# Patient Record
Sex: Male | Born: 1990 | Race: White | Hispanic: No | Marital: Single | Smoking: Never smoker
Health system: Southern US, Community
[De-identification: ages and names within clinical notes are randomized; demographics above are authoritative.]

## PROBLEM LIST (undated history)

## (undated) DIAGNOSIS — R011 Cardiac murmur, unspecified: Secondary | ICD-10-CM

## (undated) DIAGNOSIS — Z98811 Dental restoration status: Secondary | ICD-10-CM

## (undated) DIAGNOSIS — S02609A Fracture of mandible, unspecified, initial encounter for closed fracture: Secondary | ICD-10-CM

## (undated) HISTORY — PX: WISDOM TOOTH EXTRACTION: SHX21

---

## 2017-06-06 ENCOUNTER — Emergency Department (HOSPITAL_COMMUNITY): Payer: BLUE CROSS/BLUE SHIELD

## 2017-06-06 ENCOUNTER — Emergency Department (HOSPITAL_COMMUNITY): Payer: BLUE CROSS/BLUE SHIELD | Admitting: Certified Registered"

## 2017-06-06 ENCOUNTER — Observation Stay (HOSPITAL_COMMUNITY)
Admission: EM | Admit: 2017-06-06 | Discharge: 2017-06-07 | Disposition: A | Discharge status: Home / Self Care | Payer: BLUE CROSS/BLUE SHIELD | Attending: Otolaryngology | Admitting: Otolaryngology

## 2017-06-06 ENCOUNTER — Encounter (HOSPITAL_COMMUNITY): Payer: Self-pay | Admitting: Emergency Medicine

## 2017-06-06 ENCOUNTER — Encounter (HOSPITAL_COMMUNITY)
Admission: EM | Disposition: A | Discharge status: Home / Self Care | Payer: Self-pay | Source: Home / Self Care | Attending: Emergency Medicine

## 2017-06-06 DIAGNOSIS — S0181XA Laceration without foreign body of other part of head, initial encounter: Secondary | ICD-10-CM | POA: Diagnosis not present

## 2017-06-06 DIAGNOSIS — S0240DA Maxillary fracture, left side, initial encounter for closed fracture: Secondary | ICD-10-CM | POA: Diagnosis not present

## 2017-06-06 DIAGNOSIS — S0269XA Fracture of mandible of other specified site, initial encounter for closed fracture: Secondary | ICD-10-CM | POA: Diagnosis not present

## 2017-06-06 DIAGNOSIS — S0266XA Fracture of symphysis of mandible, initial encounter for closed fracture: Secondary | ICD-10-CM | POA: Diagnosis not present

## 2017-06-06 DIAGNOSIS — S0240CA Maxillary fracture, right side, initial encounter for closed fracture: Secondary | ICD-10-CM | POA: Insufficient documentation

## 2017-06-06 DIAGNOSIS — S022XXA Fracture of nasal bones, initial encounter for closed fracture: Secondary | ICD-10-CM | POA: Diagnosis not present

## 2017-06-06 DIAGNOSIS — D72829 Elevated white blood cell count, unspecified: Secondary | ICD-10-CM

## 2017-06-06 DIAGNOSIS — S02609A Fracture of mandible, unspecified, initial encounter for closed fracture: Secondary | ICD-10-CM

## 2017-06-06 DIAGNOSIS — R55 Syncope and collapse: Secondary | ICD-10-CM | POA: Diagnosis present

## 2017-06-06 DIAGNOSIS — W19XXXA Unspecified fall, initial encounter: Secondary | ICD-10-CM | POA: Diagnosis not present

## 2017-06-06 DIAGNOSIS — S02411B LeFort I fracture, initial encounter for open fracture: Secondary | ICD-10-CM

## 2017-06-06 HISTORY — DX: Fracture of mandible, unspecified, initial encounter for closed fracture: S02.609A

## 2017-06-06 HISTORY — PX: ORIF MANDIBULAR FRACTURE: SHX2127

## 2017-06-06 LAB — CBC WITH DIFFERENTIAL/PLATELET
BASOS PCT: 0 %
Basophils Absolute: 0 10*3/uL (ref 0.0–0.1)
EOS PCT: 0 %
Eosinophils Absolute: 0 10*3/uL (ref 0.0–0.7)
HCT: 46.9 % (ref 39.0–52.0)
Hemoglobin: 15.9 g/dL (ref 13.0–17.0)
Lymphocytes Relative: 3 %
Lymphs Abs: 0.5 10*3/uL — ABNORMAL LOW (ref 0.7–4.0)
MCH: 31.9 pg (ref 26.0–34.0)
MCHC: 33.9 g/dL (ref 30.0–36.0)
MCV: 94 fL (ref 78.0–100.0)
MONO ABS: 0.7 10*3/uL (ref 0.1–1.0)
Monocytes Relative: 4 %
NEUTROS ABS: 15.9 10*3/uL — AB (ref 1.7–7.7)
Neutrophils Relative %: 93 %
PLATELETS: 305 10*3/uL (ref 150–400)
RBC: 4.99 MIL/uL (ref 4.22–5.81)
RDW: 13.2 % (ref 11.5–15.5)
WBC: 17.2 10*3/uL — ABNORMAL HIGH (ref 4.0–10.5)

## 2017-06-06 LAB — COMPREHENSIVE METABOLIC PANEL
ALBUMIN: 5.4 g/dL — AB (ref 3.5–5.0)
ALK PHOS: 44 U/L (ref 38–126)
ALT: 21 U/L (ref 17–63)
ANION GAP: 10 (ref 5–15)
AST: 28 U/L (ref 15–41)
BILIRUBIN TOTAL: 2.4 mg/dL — AB (ref 0.3–1.2)
BUN: 17 mg/dL (ref 6–20)
CALCIUM: 9.9 mg/dL (ref 8.9–10.3)
CO2: 26 mmol/L (ref 22–32)
Chloride: 102 mmol/L (ref 101–111)
Creatinine, Ser: 1.13 mg/dL (ref 0.61–1.24)
GFR calc Af Amer: >60 mL/min (ref >60)
GLUCOSE: 126 mg/dL — AB (ref 65–99)
Potassium: 4.6 mmol/L (ref 3.5–5.1)
Sodium: 138 mmol/L (ref 135–145)
TOTAL PROTEIN: 8.7 g/dL — AB (ref 6.5–8.1)

## 2017-06-06 LAB — I-STAT CHEM 8, ED
BUN: 19 mg/dL (ref 6–20)
CALCIUM ION: 1.18 mmol/L (ref 1.15–1.40)
CHLORIDE: 102 mmol/L (ref 101–111)
CREATININE: 1.1 mg/dL (ref 0.61–1.24)
Glucose, Bld: 124 mg/dL — ABNORMAL HIGH (ref 65–99)
HCT: 49 % (ref 39.0–52.0)
Hemoglobin: 16.7 g/dL (ref 13.0–17.0)
Potassium: 4.6 mmol/L (ref 3.5–5.1)
SODIUM: 139 mmol/L (ref 135–145)
TCO2: 27 mmol/L (ref 22–32)

## 2017-06-06 LAB — PROTIME-INR
INR: 1.1
Prothrombin Time: 14.1 seconds (ref 11.4–15.2)

## 2017-06-06 SURGERY — OPEN REDUCTION INTERNAL FIXATION (ORIF) MANDIBULAR FRACTURE
Anesthesia: General | Site: Mouth | Laterality: Bilateral

## 2017-06-06 MED ORDER — FENTANYL CITRATE (PF) 250 MCG/5ML IJ SOLN
INTRAMUSCULAR | Status: DC | PRN
  Administered 2017-06-06 (×2): 50 ug via INTRAVENOUS

## 2017-06-06 MED ORDER — FENTANYL CITRATE (PF) 250 MCG/5ML IJ SOLN
INTRAMUSCULAR | Status: AC
  Filled 2017-06-06: qty 5

## 2017-06-06 MED ORDER — PROPOFOL 10 MG/ML IV BOLUS
INTRAVENOUS | Status: AC
  Filled 2017-06-06: qty 20

## 2017-06-06 MED ORDER — PHENYLEPHRINE HCL 10 MG/ML IJ SOLN
INTRAMUSCULAR | Status: DC | PRN
  Administered 2017-06-06 (×2): 40 ug via INTRAVENOUS
  Administered 2017-06-06: 80 ug via INTRAVENOUS

## 2017-06-06 MED ORDER — MORPHINE SULFATE (PF) 4 MG/ML IV SOLN
4.0000 mg | Freq: Once | INTRAVENOUS | Status: AC
  Administered 2017-06-06: 4 mg via INTRAVENOUS
  Filled 2017-06-06: qty 1

## 2017-06-06 MED ORDER — PROMETHAZINE HCL 25 MG/ML IJ SOLN: 6.2500 mg | INTRAMUSCULAR | Status: DC | PRN

## 2017-06-06 MED ORDER — LIDOCAINE-EPINEPHRINE (PF) 1 %-1:200000 IJ SOLN
INTRAMUSCULAR | Status: AC
  Filled 2017-06-06: qty 30

## 2017-06-06 MED ORDER — FENTANYL CITRATE (PF) 250 MCG/5ML IJ SOLN
INTRAMUSCULAR | Status: AC
Start: 2017-06-06 — End: ?
  Filled 2017-06-06: qty 5

## 2017-06-06 MED ORDER — SODIUM CHLORIDE 0.9 % IV BOLUS (SEPSIS)
1000.0000 mL | Freq: Once | INTRAVENOUS | Status: AC
  Administered 2017-06-06: 1000 mL via INTRAVENOUS

## 2017-06-06 MED ORDER — LIDOCAINE-EPINEPHRINE 1 %-1:100000 IJ SOLN
INTRAMUSCULAR | Status: DC | PRN
  Administered 2017-06-06: 6 mL

## 2017-06-06 MED ORDER — ROCURONIUM BROMIDE 10 MG/ML (PF) SYRINGE
PREFILLED_SYRINGE | INTRAVENOUS | Status: AC
  Filled 2017-06-06: qty 5

## 2017-06-06 MED ORDER — ONDANSETRON HCL 4 MG/2ML IJ SOLN
INTRAMUSCULAR | Status: AC
  Filled 2017-06-06: qty 2

## 2017-06-06 MED ORDER — TETANUS-DIPHTH-ACELL PERTUSSIS 5-2.5-18.5 LF-MCG/0.5 IM SUSP: 0.5000 mL | Freq: Once | INTRAMUSCULAR | Status: DC

## 2017-06-06 MED ORDER — ALBUMIN HUMAN 5 % IV SOLN
INTRAVENOUS | Status: DC | PRN
  Administered 2017-06-06 (×2): via INTRAVENOUS

## 2017-06-06 MED ORDER — EPHEDRINE 5 MG/ML INJ
INTRAVENOUS | Status: AC
  Filled 2017-06-06: qty 10

## 2017-06-06 MED ORDER — OXYMETAZOLINE HCL 0.05 % NA SOLN
NASAL | Status: AC
  Filled 2017-06-06: qty 15

## 2017-06-06 MED ORDER — HYDROMORPHONE HCL 1 MG/ML IJ SOLN: 0.2500 mg | INTRAMUSCULAR | Status: DC | PRN

## 2017-06-06 MED ORDER — SUCCINYLCHOLINE 20MG/ML (10ML) SYRINGE FOR MEDFUSION PUMP - OPTIME
INTRAMUSCULAR | Status: DC | PRN
  Administered 2017-06-06: 120 mg via INTRAVENOUS

## 2017-06-06 MED ORDER — MIDAZOLAM HCL 2 MG/2ML IJ SOLN
INTRAMUSCULAR | Status: AC
  Filled 2017-06-06: qty 2

## 2017-06-06 MED ORDER — DEXAMETHASONE SODIUM PHOSPHATE 10 MG/ML IJ SOLN
INTRAMUSCULAR | Status: AC
  Filled 2017-06-06: qty 1

## 2017-06-06 MED ORDER — LIDOCAINE-EPINEPHRINE (PF) 2 %-1:200000 IJ SOLN
10.0000 mL | Freq: Once | INTRAMUSCULAR | Status: AC
  Administered 2017-06-06: 10 mL
  Filled 2017-06-06: qty 20

## 2017-06-06 MED ORDER — OXYMETAZOLINE HCL 0.05 % NA SOLN
NASAL | Status: DC | PRN
  Administered 2017-06-06 (×2): 1 via NASAL

## 2017-06-06 MED ORDER — LIDOCAINE HCL (CARDIAC) 20 MG/ML IV SOLN
INTRAVENOUS | Status: DC | PRN
  Administered 2017-06-06: 60 mg via INTRATRACHEAL

## 2017-06-06 MED ORDER — CLINDAMYCIN PHOSPHATE 600 MG/50ML IV SOLN
600.0000 mg | Freq: Once | INTRAVENOUS | Status: AC
  Administered 2017-06-06: 600 mg via INTRAVENOUS
  Filled 2017-06-06: qty 50

## 2017-06-06 MED ORDER — SUCCINYLCHOLINE CHLORIDE 200 MG/10ML IV SOSY
PREFILLED_SYRINGE | INTRAVENOUS | Status: AC
  Filled 2017-06-06: qty 10

## 2017-06-06 MED ORDER — PHENYLEPHRINE 40 MCG/ML (10ML) SYRINGE FOR IV PUSH (FOR BLOOD PRESSURE SUPPORT)
PREFILLED_SYRINGE | INTRAVENOUS | Status: AC
  Filled 2017-06-06: qty 10

## 2017-06-06 MED ORDER — GLYCOPYRROLATE 0.2 MG/ML IJ SOLN
INTRAMUSCULAR | Status: DC | PRN
  Administered 2017-06-06: 0.2 mg via INTRAVENOUS

## 2017-06-06 MED ORDER — MIDAZOLAM HCL 2 MG/2ML IJ SOLN
INTRAMUSCULAR | Status: DC | PRN
  Administered 2017-06-06: 2 mg via INTRAVENOUS

## 2017-06-06 MED ORDER — OXYCODONE HCL 5 MG PO TABS: 5.0000 mg | ORAL_TABLET | Freq: Once | ORAL | Status: DC | PRN

## 2017-06-06 MED ORDER — LIDOCAINE 2% (20 MG/ML) 5 ML SYRINGE
INTRAMUSCULAR | Status: AC
  Filled 2017-06-06: qty 5

## 2017-06-06 MED ORDER — OXYCODONE-ACETAMINOPHEN 5-325 MG PO TABS
1.0000 | ORAL_TABLET | Freq: Once | ORAL | Status: AC
  Administered 2017-06-06: 1 via ORAL
  Filled 2017-06-06: qty 1

## 2017-06-06 MED ORDER — PROPOFOL 10 MG/ML IV BOLUS
INTRAVENOUS | Status: DC | PRN
  Administered 2017-06-06: 150 mg via INTRAVENOUS

## 2017-06-06 MED ORDER — OXYCODONE HCL 5 MG/5ML PO SOLN: 5.0000 mg | Freq: Once | ORAL | Status: DC | PRN

## 2017-06-06 MED ORDER — LACTATED RINGERS IV SOLN
INTRAVENOUS | Status: DC | PRN
  Administered 2017-06-06 (×2): via INTRAVENOUS

## 2017-06-06 MED ORDER — DEXAMETHASONE SODIUM PHOSPHATE 10 MG/ML IJ SOLN
INTRAMUSCULAR | Status: DC | PRN
  Administered 2017-06-06: 10 mg via INTRAVENOUS

## 2017-06-06 SURGICAL SUPPLY — 32 items
BIT DRILL TWIST 1.3X5 (BIT) ×1
BIT DRILL TWIST 1.3X5MM (BIT) ×1 IMPLANT
BLADE CLIPPER SURG (BLADE) ×3 IMPLANT
CANISTER SUCT 3000ML PPV (MISCELLANEOUS) ×3 IMPLANT
CLEANER TIP ELECTROSURG 2X2 (MISCELLANEOUS) ×3 IMPLANT
COVER SURGICAL LIGHT HANDLE (MISCELLANEOUS) ×3 IMPLANT
DECANTER SPIKE VIAL GLASS SM (MISCELLANEOUS) ×3 IMPLANT
DRAPE HALF SHEET 40X57 (DRAPES) ×3 IMPLANT
DRILL BIT TWIST 1.3X5MM (BIT) ×2
ELECT COATED BLADE 2.86 ST (ELECTRODE) ×3 IMPLANT
ELECT REM PT RETURN 9FT ADLT (ELECTROSURGICAL) ×3
ELECTRODE REM PT RTRN 9FT ADLT (ELECTROSURGICAL) ×1 IMPLANT
GLOVE BIO SURGEON STRL SZ7.5 (GLOVE) ×6 IMPLANT
GOWN STRL REUS W/ TWL LRG LVL3 (GOWN DISPOSABLE) ×2 IMPLANT
GOWN STRL REUS W/TWL LRG LVL3 (GOWN DISPOSABLE) ×4
KIT BASIN OR (CUSTOM PROCEDURE TRAY) ×3 IMPLANT
KIT ROOM TURNOVER OR (KITS) ×3 IMPLANT
NEEDLE HYPO 25GX1X1/2 BEV (NEEDLE) ×3 IMPLANT
NEEDLE PRECISIONGLIDE 27X1.5 (NEEDLE) IMPLANT
NS IRRIG 1000ML POUR BTL (IV SOLUTION) ×3 IMPLANT
PAD ARMBOARD 7.5X6 YLW CONV (MISCELLANEOUS) ×6 IMPLANT
PLATE MID FACE 24H STRAIGHT (Plate) ×3 IMPLANT
PLATE MID FACE 6H 8MM 100D RT (Plate) ×3 IMPLANT
SCISSORS WIRE ANG 4 3/4 DISP (INSTRUMENTS) ×3 IMPLANT
SCREW MIDFACE 1.7X4 SLF DRILL (Screw) ×21 IMPLANT
SCREW MIDFACE 1.7X4MM SLF TAP (Screw) ×30 IMPLANT
SCREW MIDFACE 1.9X5MM (Screw) ×6 IMPLANT
SUT MON AB 3-0 SH 27 (SUTURE) ×4
SUT MON AB 3-0 SH27 (SUTURE) ×2 IMPLANT
SUT STEEL 5 (SUTURE) ×3 IMPLANT
TOWEL OR 17X24 6PK STRL BLUE (TOWEL DISPOSABLE) ×3 IMPLANT
TRAY ENT MC OR (CUSTOM PROCEDURE TRAY) ×3 IMPLANT

## 2017-06-06 NOTE — ED Provider Notes (Signed)
MSE was initiated and I personally evaluated the patient and placed orders (if any) at  13:15 on June 06, 2017.  The patient appears stable so that the remainder of the MSE may be completed by another provider.  26 year old male presenting to the emergency department via EMS after a syncopal episode that occurred at 12:15 PM. The patient reports he received a Tdap vaccination at 12:05 PM. He reports he was walking out to his car when he felt "woozy" and lightheaded. He reports when he came to it was 12:25 PM and EMS has been called.   In the ED he complains of a laceration to his chin, tooth #25 is loose and displaced. He denies TTP over the jaw or face. There appears to be a laceration between tooth #25 and 26. His frenulum is intact. Consulted the patient's dentist Dr. Darnelle MaffucciJudy Walker and spoke with the on-call dentist. Dr. Nicholes RoughApplebaum who states that the patient has an alveolar fracture that needs to be splinted and repositioned as soon as possible. No oral surgeon on call. ENT consult pending.  There is also a gaping, hemostatic laceration to the chin. Pain controlled in the ED with percocet. The patient was initially evaluated in [facility] and will be moved to an acute room for further work up and evaluation.         [name], Kamarri Lovvorn A, PA-C 06/06/17 2318    [name], Joshua G, MD 06/07/17 1159

## 2017-06-06 NOTE — ED Triage Notes (Addendum)
Pt here via EMS following an episode of syncope. [place] [employer]. When he walked out, he experienced an episode of syncope where he fall and endured a laceration to the bottom of his chin. Bleeding is controlled. Pt has 1 inch lac to bottom of chin and abrasion to fingers, knees, and upper lip.

## 2017-06-06 NOTE — ED Notes (Signed)
Pt to CT 3 with RN

## 2017-06-06 NOTE — Anesthesia Procedure Notes (Signed)
Anesthesia Procedure Note Afrin placed in left nares x 2 doses. 6.365mm nasal trumpet lubricated with jelly and placed into left nares and then removed. Ambu flexible bronchoscope placed into left nares while patient sedated and spontaneously ventilating. Vocal cords visualized with grade 1 view and patient induced.

## 2017-06-06 NOTE — ED Notes (Signed)
Bed: WTR8 Expected date: 06/06/17 Expected time: 12:54 PM Means of arrival: Ambulance Comments: Tdap followed by syncopal episode, a&O x 4 chin lac

## 2017-06-06 NOTE — ED Provider Notes (Signed)
Emergency Department Provider Note   I have reviewed the triage vital signs and the nursing notes.   HISTORY  Chief Complaint Laceration and Loss of Consciousness   HPI Carl Terry is a 26 y.o. male with no significant PMH presents to the ED for evaluation of face trauma after syncope in the setting of immunization administration. The patient was updating some vaccinations at the local drug store. A vaccination was administered and he proceeded to have a syncopal event. He tried to grab the railing but missed and fell while having a syncopal event. No CP, SOB, or palpitations during the event. No medication changes. No fever or chills. No history of similar syncope but does note a fear of needles.   History reviewed. No pertinent past medical history.  Patient Active Problem List   Diagnosis Date Noted  . Mandible fracture (HCC) 06/07/2017    History reviewed. No pertinent surgical history.    Allergies Patient has no known allergies.  No family history on file.  Social History Social History  Substance Use Topics  . Smoking status: Never Smoker  . Smokeless tobacco: Not on file  . Alcohol use No    Review of Systems  Constitutional: No fever/chills Eyes: No visual changes. ENT: No sore throat. Cardiovascular: Denies chest pain. Respiratory: Denies shortness of breath. Gastrointestinal: No abdominal pain.  No nausea, no vomiting.  No diarrhea.  No constipation. Genitourinary: Negative for dysuria. Musculoskeletal: Negative for back pain. Skin: Negative for rash. Neurological: Negative for headaches, focal weakness or numbness.  10-point ROS otherwise negative.  ____________________________________________   PHYSICAL EXAM:  VITAL SIGNS: Vitals:   06/07/17 0210 06/07/17 0603  BP: (!) 130/37 (!) 128/57  Pulse: 80 88  Resp: 13 13  Temp: 99.3 F (37.4 C) 99.3 F (37.4 C)  SpO2: 100% 100%     Constitutional: Alert and oriented. Well appearing  and in no acute distress. Eyes: Conjunctivae are normal. PERRL. EOMI. Head: Atraumatic. Ears:  Healthy appearing ear canals and TMs bilaterally Nose: No congestion/rhinnorhea. No septal hematoma.  Mouth/Throat: Mucous membranes are moist. Mandibular pain with loose and displaced lower incisors. No tongue injury. No tenderness over the orbital rim.  Neck: No stridor. No cervical spine tenderness to palpation. Cardiovascular: Normal rate, regular rhythm. Good peripheral circulation. Grossly normal heart sounds.   Respiratory: Normal respiratory effort.  No retractions. Lungs CTAB. Gastrointestinal: Soft and nontender. No distention.  Musculoskeletal: No lower extremity tenderness nor edema. No gross deformities of extremities. Neurologic:  Normal speech and language. No gross focal neurologic deficits are appreciated.  Skin:  Skin is warm, dry and intact. 4 cm chin laceration.   ____________________________________________   LABS (all labs ordered are listed, but only abnormal results are displayed)  Labs Reviewed  COMPREHENSIVE METABOLIC PANEL - Abnormal; Notable for the following:       Result Value   Glucose, Bld 126 (*)    Total Protein 8.7 (*)    Albumin 5.4 (*)    Total Bilirubin 2.4 (*)    All other components within normal limits  CBC WITH DIFFERENTIAL/PLATELET - Abnormal; Notable for the following:    WBC 17.2 (*)    Neutro Abs 15.9 (*)    Lymphs Abs 0.5 (*)    All other components within normal limits  I-STAT CHEM 8, ED - Abnormal; Notable for the following:    Glucose, Bld 124 (*)    All other components within normal limits  PROTIME-INR  I-STAT CHEM 8, ED  TYPE AND SCREEN  ABO/RH   ____________________________________________  EKG   EKG Interpretation  Date/Time:  Saturday June 06 2017 16:20:53 EDT Ventricular Rate:  70 PR Interval:    QRS Duration: 102 QT Interval:  374 QTC Calculation: 404 R Axis:   112 Text Interpretation:  Sinus rhythm Probable  right ventricular hypertrophy No STEMI.  Confirmed by Alona Bene [phone]) on 06/06/2017 4:24:21 PM       ____________________________________________  RADIOLOGY  Ct Head Wo Contrast  Result Date: 06/06/2017 CLINICAL DATA:  27 year old male with history of syncope. History of trauma from a fall with injury to the chin. EXAM: CT HEAD WITHOUT CONTRAST CT CERVICAL SPINE WITHOUT CONTRAST TECHNIQUE: Multidetector CT imaging of the head and cervical spine was performed following the standard protocol without intravenous contrast. Multiplanar CT image reconstructions of the cervical spine were also generated. COMPARISON:  None. FINDINGS: CT HEAD FINDINGS Brain: No evidence of acute infarction, hemorrhage, hydrocephalus, extra-axial collection or mass lesion/mass effect. Vascular: No hyperdense vessel or unexpected calcification. Skull: Multiple facial bone fractures again noted (see report for maxillofacial CT 06/06/2017 for full description). Sinuses/Orbits: High attenuation fluid levels in the maxillary sinuses bilaterally, compatible with hemosinus. Other: Gas in the deep soft tissues of the face related air facial bone fractures again noted. CT CERVICAL SPINE FINDINGS Alignment: Normal. Skull base and vertebrae: Please see separate dictation for maxillofacial CT scan 06/06/2017 for full description of multiple facial bone fractures previously described. No other acute fracture of the cervical spine. No primary bone lesion or focal pathologic process. Soft tissues and spinal canal: No prevertebral fluid or swelling. No visible canal hematoma. Disc levels: No significant degenerative disc disease or facet arthropathy. Upper chest: Unremarkable. Other: None. IMPRESSION: 1. No evidence of significant acute traumatic injury to the brain. The appearance of the brain is normal. 2. No evidence of significant acute traumatic injury to the cervical spine. 3. Multiple facial bone fractures redemonstrated as previously  described on maxillofacial CT 06/06/2017 (see that report for full description). Electronically Signed   By: Trudie Reed M.D.   On: 06/06/2017 21:02   Ct Cervical Spine Wo Contrast  Result Date: 06/06/2017 CLINICAL DATA:  26 year old male with history of syncope. History of trauma from a fall with injury to the chin. EXAM: CT HEAD WITHOUT CONTRAST CT CERVICAL SPINE WITHOUT CONTRAST TECHNIQUE: Multidetector CT imaging of the head and cervical spine was performed following the standard protocol without intravenous contrast. Multiplanar CT image reconstructions of the cervical spine were also generated. COMPARISON:  None. FINDINGS: CT HEAD FINDINGS Brain: No evidence of acute infarction, hemorrhage, hydrocephalus, extra-axial collection or mass lesion/mass effect. Vascular: No hyperdense vessel or unexpected calcification. Skull: Multiple facial bone fractures again noted (see report for maxillofacial CT 06/06/2017 for full description). Sinuses/Orbits: High attenuation fluid levels in the maxillary sinuses bilaterally, compatible with hemosinus. Other: Gas in the deep soft tissues of the face related air facial bone fractures again noted. CT CERVICAL SPINE FINDINGS Alignment: Normal. Skull base and vertebrae: Please see separate dictation for maxillofacial CT scan 06/06/2017 for full description of multiple facial bone fractures previously described. No other acute fracture of the cervical spine. No primary bone lesion or focal pathologic process. Soft tissues and spinal canal: No prevertebral fluid or swelling. No visible canal hematoma. Disc levels: No significant degenerative disc disease or facet arthropathy. Upper chest: Unremarkable. Other: None. IMPRESSION: 1. No evidence of significant acute traumatic injury to the brain. The appearance of the brain is normal. 2. No  evidence of significant acute traumatic injury to the cervical spine. 3. Multiple facial bone fractures redemonstrated as previously  described on maxillofacial CT 06/06/2017 (see that report for full description). Electronically Signed   By: Trudie Reedaniel  Entrikin M.D.   On: 06/06/2017 21:02   Ct Maxillofacial Wo Contrast  Result Date: 06/06/2017 CLINICAL DATA:  Syncopal episode after getting a tetanus shot, fell striking chin, laceration, facial trauma EXAM: CT MAXILLOFACIAL WITHOUT CONTRAST TECHNIQUE: Multidetector CT imaging of the maxillofacial structures was performed. Multiplanar CT image reconstructions were also generated. Right side of face marked with BB. COMPARISON:  None. FINDINGS: Osseous: Multiple facial bone fractures: BILATERAL displaced mandibular condyle fractures. Nondisplaced fracture of mentum of the mandible extending into the bases of teeth 25 and 26. Minimally displaced fractures of the anterior and lateral walls of BILATERAL maxillary sinuses. Fracture of nasal septum, which is deviated to the RIGHT. BILATERAL maxillary fractures extending into BILATERAL pterygoid plates and into hard palate on LEFT. Orbits, zygomas, nasal bones, and visualized cervical spine intact. Orbits: Intraorbital soft tissue planes clear bilaterally Sinuses: Air-fluid levels in the maxillary sinuses bilaterally, fluid high attenuation consistent with blood. Soft tissues: Soft tissue swelling anterior inferior to the mentum of the mandible. Prevertebral soft tissues normal thickness. Limited intracranial: Unremarkable IMPRESSION: BILATERAL displaced mandibular condyle fractures. Nondisplaced fracture of the mandibular mentum. Multiple facial bone fractures as above compatible with BILATERAL LeFort 1 fractures. Electronically Signed   By: Ulyses SouthwardMark  Boles M.D.   On: 06/06/2017 17:49    ____________________________________________   PROCEDURES  Procedure(s) performed:   Procedures  None ____________________________________________   INITIAL IMPRESSION / ASSESSMENT AND PLAN / ED COURSE  Pertinent labs & imaging results that were available  during my care of the patient were reviewed by me and considered in my medical decision making (see chart for details).  Patient presents to the ED with syncope and dental injury. Will contact on-call dentist. No oral surgeon on call here. Outpatient dentist on call is out of town but recommends splinting today.   Patient with multiple mandibular fractures. Spoke with Dr. Jenne PaneBates regarding CT and [facility] and [facility]. Starting IV Clinda. Have sent pre-op labs. Patient NPO. EDP Dr. Ranae PalmsYelverton accepting physician in ED-ED transfer. Calling for transport at this time.   ____________________________________________  FINAL CLINICAL IMPRESSION(S) / ED DIAGNOSES  Final diagnoses:  Open [place] I fracture, initial encounter (HCC)  Facial laceration, initial encounter  Syncope and collapse  Hyperbilirubinemia  Leukocytosis, unspecified type     MEDICATIONS GIVEN DURING THIS VISIT:  Medications  bacitracin ointment 1 application (1 application Topical Given 06/07/17 1143)  dextrose 5 % and 0.45 % NaCl with KCl 20 mEq/L infusion ( Intravenous New Bag/Given 06/07/17 0144)  clindamycin (CLEOCIN) IVPB 600 mg (600 mg Intravenous New Bag/Given 06/07/17 1143)  HYDROcodone-acetaminophen (HYCET) 7.5-325 mg/15 ml solution 10-15 mL (15 mLs Oral Given 06/07/17 0830)  ondansetron (ZOFRAN) tablet 4 mg (not administered)    Or  ondansetron (ZOFRAN) injection 4 mg (not administered)  morphine 4 MG/ML injection 2-4 mg (not administered)  dextrose 5 % and 0.45 % NaCl with KCl 20 mEq/L 20-5-0.45 MEQ/L-%-% infusion (not administered)  oxyCODONE-acetaminophen (PERCOCET/ROXICET) 5-325 MG per tablet 1 tablet (1 tablet Oral Given 06/06/17 1501)  lidocaine-EPINEPHrine (XYLOCAINE W/EPI) 2 %-1:200000 (PF) injection 10 mL (10 mLs Infiltration Given 06/06/17 1502)  sodium chloride 0.9 % bolus 1,000 mL (0 mLs Intravenous Stopped 06/06/17 1650)  sodium chloride 0.9 % bolus 1,000 mL (0 mLs  Intravenous  Stopped 06/06/17 1650)  morphine 4 MG/ML injection 4 mg (4 mg Intravenous Given 06/06/17 1650)  sodium chloride 0.9 % bolus 1,000 mL (0 mLs Intravenous Stopped 06/06/17 1839)  clindamycin (CLEOCIN) IVPB 600 mg (0 mg Intravenous Stopped 06/06/17 2028)  clindamycin (CLEOCIN) 600 MG/50ML IVPB (0 mg  Stopped 06/07/17 0205)     NEW OUTPATIENT MEDICATIONS STARTED DURING THIS VISIT:  There are no discharge medications for this patient.   Note:  This document was prepared using Dragon voice recognition software and may include unintentional dictation errors.  Alona Bene, MD Emergency Medicine    Teryn Gust, Arlyss Repress, MD 06/07/17 [phone]

## 2017-06-06 NOTE — ED Notes (Addendum)
Pt here with [facility] from [facility] transferred for surgery by Dr Jenne PaneBates ENT; pt reportedly had [place]opal episode [place] [employer] today where he fell forward striking face on concrete and lacerations; pt has multiple face fractures; pt now A+O; EMS VS- 143/73, 91HR, R16

## 2017-06-06 NOTE — ED Notes (Signed)
[facility]AL[facility] documentation verified with this RN. Will assess vitals 15 mins prior to departure.

## 2017-06-06 NOTE — ED Provider Notes (Signed)
LACERATION REPAIR Performed by: Barkley BoardsMia A Mataio Mele Consent: Verbal consent obtained. Risks and benefits: risks, benefits and alternatives were discussed Patient identity confirmed: provided demographic data Time out performed prior to procedure Prepped and Draped in normal sterile fashion Wound explored Laceration Location: chin Laceration Length: 2 cm No Foreign Bodies seen or palpated. The base of the wound was visualized in a bloodless field and through full ROM Anesthesia: local infiltration Local anesthetic: lidocaine 2% with epinephrine Anesthetic total: 8 ml Irrigation method: syringe Amount of cleaning: standard Skin closure: sutures Number of sutures or staples: 4 Technique: simple interrupted  Patient tolerance: Patient tolerated the procedure well with no immediate complications.    Barkley BoardsMcDonald, Giuliano Preece A, PA-C 06/06/17 1844    [name], Joshua G, MD 06/07/17 1158

## 2017-06-06 NOTE — ED Notes (Signed)
Jenne PaneBates, MD at bedside concerned there was no CT head/c-spine performed-- asked that EDP evaluate before surgery

## 2017-06-06 NOTE — ED Notes (Signed)
Restarted Clendamycin per Jenne PaneBates MD. MD reports he is on way in to speak with patient currently.

## 2017-06-06 NOTE — ED Provider Notes (Signed)
Care assumed upon transfer, originally seen at [facility] by Dr. Jacqulyn Bath, please see their notes for full documentation of patient's complaint/HPI. Briefly, pt here with syncopal event after getting Tdap immunization, had facial trauma from syncopal event. Results so far show multiple facial fx's; a facial lac to his chin was repaired by Frederik Pear PA-C prior to transport. Dr. Jacqulyn Bath discussed with Dr. Jenne Pane of ENT who requested transfer here to take him to the OR for repair. IV Clinda given prior to transport.    Physical Exam  BP 132/67 (BP Location: Right Arm)   Pulse 68   Resp 15   SpO2 99%   Physical Exam Gen: VSS, NAD HEENT: EOMI, MMM, dried blood on R cheek and chin, chin laceration sutured; deferred full ENT eval at this time Resp: no resp distress CV: rate WNL Abd: appearance normal, nondistended MsK: moving all extremities with ease Neuro: A&O x4  ED Course  Procedures Results for orders placed or performed during the hospital encounter of 06/06/17  Comprehensive metabolic panel  Result Value Ref Range   Sodium 138 135 - 145 mmol/L   Potassium 4.6 3.5 - 5.1 mmol/L   Chloride 102 101 - 111 mmol/L   CO2 26 22 - 32 mmol/L   Glucose, Bld 126 (H) 65 - 99 mg/dL   BUN 17 6 - 20 mg/dL   Creatinine, Ser 1.30 0.61 - 1.24 mg/dL   Calcium 9.9 8.9 - 86.5 mg/dL   Total Protein 8.7 (H) 6.5 - 8.1 g/dL   Albumin 5.4 (H) 3.5 - 5.0 g/dL   AST 28 15 - 41 U/L   ALT 21 17 - 63 U/L   Alkaline Phosphatase 44 38 - 126 U/L   Total Bilirubin 2.4 (H) 0.3 - 1.2 mg/dL   GFR calc non Af Amer >60 >60 mL/min   GFR calc Af Amer >60 >60 mL/min   Anion gap 10 5 - 15  CBC with Differential  Result Value Ref Range   WBC 17.2 (H) 4.0 - 10.5 K/uL   RBC 4.99 4.22 - 5.81 MIL/uL   Hemoglobin 15.9 13.0 - 17.0 g/dL   HCT 78.4 69.6 - 29.5 %   MCV 94.0 78.0 - 100.0 fL   MCH 31.9 26.0 - 34.0 pg   MCHC 33.9 30.0 - 36.0 g/dL   RDW 28.4 13.2 - 44.0 %   Platelets 305 150 - 400 K/uL   Neutrophils Relative % 93 %   Neutro Abs 15.9 (H) 1.7 - 7.7 K/uL   Lymphocytes Relative 3 %   Lymphs Abs 0.5 (L) 0.7 - 4.0 K/uL   Monocytes Relative 4 %   Monocytes Absolute 0.7 0.1 - 1.0 K/uL   Eosinophils Relative 0 %   Eosinophils Absolute 0.0 0.0 - 0.7 K/uL   Basophils Relative 0 %   Basophils Absolute 0.0 0.0 - 0.1 K/uL  Protime-INR  Result Value Ref Range   Prothrombin Time 14.1 11.4 - 15.2 seconds   INR 1.10   I-Stat Chem 8, ED  Result Value Ref Range   Sodium 139 135 - 145 mmol/L   Potassium 4.6 3.5 - 5.1 mmol/L   Chloride 102 101 - 111 mmol/L   BUN 19 6 - 20 mg/dL   Creatinine, Ser 1.02 0.61 - 1.24 mg/dL   Glucose, Bld 725 (H) 65 - 99 mg/dL   Calcium, Ion 3.66 4.40 - 1.40 mmol/L   TCO2 27 22 - 32 mmol/L   Hemoglobin 16.7 13.0 - 17.0 g/dL   HCT  49.0 39.0 - 52.0 %   Ct Maxillofacial Wo Contrast  Result Date: 06/06/2017 CLINICAL DATA:  Syncopal episode after getting a tetanus shot, fell striking chin, laceration, facial trauma EXAM: CT MAXILLOFACIAL WITHOUT CONTRAST TECHNIQUE: Multidetector CT imaging of the maxillofacial structures was performed. Multiplanar CT image reconstructions were also generated. Right side of face marked with BB. COMPARISON:  None. FINDINGS: Osseous: Multiple facial bone fractures: BILATERAL displaced mandibular condyle fractures. Nondisplaced fracture of mentum of the mandible extending into the bases of teeth 25 and 26. Minimally displaced fractures of the anterior and lateral walls of BILATERAL maxillary sinuses. Fracture of nasal septum, which is deviated to the RIGHT. BILATERAL maxillary fractures extending into BILATERAL pterygoid plates and into hard palate on LEFT. Orbits, zygomas, nasal bones, and visualized cervical spine intact. Orbits: Intraorbital soft tissue planes clear bilaterally Sinuses: Air-fluid levels in the maxillary sinuses bilaterally, fluid high attenuation consistent with blood. Soft tissues: Soft tissue swelling anterior inferior to the mentum of the mandible.  Prevertebral soft tissues normal thickness. Limited intracranial: Unremarkable IMPRESSION: BILATERAL displaced mandibular condyle fractures. Nondisplaced fracture of the mandibular mentum. Multiple facial bone fractures as above compatible with BILATERAL LeFort 1 fractures. Electronically Signed   By: Ulyses SouthwardMark  Boles M.D.   On: 06/06/2017 17:49     Meds ordered this encounter  Medications  . oxyCODONE-acetaminophen (PERCOCET/ROXICET) 5-325 MG per tablet 1 tablet  . lidocaine-EPINEPHrine (XYLOCAINE W/EPI) 2 %-1:200000 (PF) injection 10 mL  . sodium chloride 0.9 % bolus 1,000 mL  . sodium chloride 0.9 % bolus 1,000 mL  . DISCONTD: Tdap (BOOSTRIX) injection 0.5 mL  . morphine 4 MG/ML injection 4 mg  . sodium chloride 0.9 % bolus 1,000 mL  . clindamycin (CLEOCIN) IVPB 600 mg    Order Specific Question:   Antibiotic Indication:    Answer:   Surgical Prophylaxis     MDM:   ICD-10-CM   1. Open Jerry Car[place] I fracture, initial encounter (HCC) S02.411B   2. Facial laceration, initial encounter S01.81XA   3. Syncope and collapse R55   4. Hyperbilirubinemia E80.6   5. Leukocytosis, unspecified type D72.829     7:08 PM- [facility], denies any concerns or complaints at this time, states pain is under control after last morphine dose given prior to transfer. Denies CP, SOB, abd pain/n/v, headache, vision changes, numbness/tingling/focal weakness, or any other acute complaints at this time. Declines any pain medication at this time. Will page Dr. Jenne PaneBates and inform him of pt's arrival here. Of note, CBC w/diff showing leukocytosis, likely from stress demargination; CMP with mildly elevated bilirubin at 2.4 which could be dehydration related, doubt need for further emergent work up at this time, but may need outpatient re-check after discharge.   7:23 PM Dr. Jenne PaneBates of ENT returning page and will admit and take to the OR for repair of his facial injuries/fractures. Holding orders to be placed by admitting  team. Please see their notes for further documentation of care. I appreciate their help with this pleasant pt's care. Pt stable at time of admission.     [address], Ches[place]Mercedes, [place] JerseyPA-C 06/06/17 1923    Marily MemosMesner, Jason, MD 06/06/17 2041

## 2017-06-06 NOTE — ED Notes (Signed)
Pt to SS36 with RN to give beside report

## 2017-06-06 NOTE — ED Notes (Signed)
Patient transported to CT 

## 2017-06-06 NOTE — H&P (Addendum)
Carl Terry is an 26 y.o. male.   Chief Complaint: Facial fracture HPI: 26 year old male passed out earlier today when he got an immunization and struck his chin on the hard floor.  He sustained a chin laceration and came to the ER at [facility].  Upon initial evaluation, an inferior alveolar ridge fracture was suspected.  Upon further evaluation, a maxillofacial CT demonstrated multiple facial fractures.  He was transferred to the [facility] ER for surgery.  History reviewed. No pertinent past medical history.  History reviewed. No pertinent surgical history.  No family history on file. Social History:  reports that he has never smoked. He does not have any smokeless tobacco history on file. He reports that he does not drink alcohol or use drugs.  Allergies: No Known Allergies   (Not in a hospital admission)  Results for orders placed or performed during the hospital encounter of 06/06/17 (from the past 48 hour(s))  I-Stat Chem 8, ED     Status: Abnormal   Collection Time: 06/06/17  4:36 PM  Result Value Ref Range   Sodium 139 135 - 145 mmol/L   Potassium 4.6 3.5 - 5.1 mmol/L   Chloride 102 101 - 111 mmol/L   BUN 19 6 - 20 mg/dL   Creatinine, Ser 1.10 0.61 - 1.24 mg/dL   Glucose, Bld 124 (H) 65 - 99 mg/dL   Calcium, Ion 1.18 1.15 - 1.40 mmol/L   TCO2 27 22 - 32 mmol/L   Hemoglobin 16.7 13.0 - 17.0 g/dL   HCT 49.0 39.0 - 52.0 %  Comprehensive metabolic panel     Status: Abnormal   Collection Time: 06/06/17  6:03 PM  Result Value Ref Range   Sodium 138 135 - 145 mmol/L   Potassium 4.6 3.5 - 5.1 mmol/L   Chloride 102 101 - 111 mmol/L   CO2 26 22 - 32 mmol/L   Glucose, Bld 126 (H) 65 - 99 mg/dL   BUN 17 6 - 20 mg/dL   Creatinine, Ser 1.13 0.61 - 1.24 mg/dL   Calcium 9.9 8.9 - 10.3 mg/dL   Total Protein 8.7 (H) 6.5 - 8.1 g/dL   Albumin 5.4 (H) 3.5 - 5.0 g/dL   AST 28 15 - 41 U/L   ALT 21 17 - 63 U/L   Alkaline Phosphatase 44 38 - 126 U/L   Total Bilirubin 2.4 (H) 0.3 -  1.2 mg/dL   GFR calc non Af Amer >60 >60 mL/min   GFR calc Af Amer >60 >60 mL/min    Comment: (NOTE) The eGFR has been calculated using the CKD EPI equation. This calculation has not been validated in all clinical situations. eGFR's persistently <60 mL/min signify possible Chronic Kidney Disease.    Anion gap 10 5 - 15  CBC with Differential     Status: Abnormal   Collection Time: 06/06/17  6:03 PM  Result Value Ref Range   WBC 17.2 (H) 4.0 - 10.5 K/uL   RBC 4.99 4.22 - 5.81 MIL/uL   Hemoglobin 15.9 13.0 - 17.0 g/dL   HCT 46.9 39.0 - 52.0 %   MCV 94.0 78.0 - 100.0 fL   MCH 31.9 26.0 - 34.0 pg   MCHC 33.9 30.0 - 36.0 g/dL   RDW 13.2 11.5 - 15.5 %   Platelets 305 150 - 400 K/uL   Neutrophils Relative % 93 %   Neutro Abs 15.9 (H) 1.7 - 7.7 K/uL   Lymphocytes Relative 3 %   Lymphs  Abs 0.5 (L) 0.7 - 4.0 K/uL   Monocytes Relative 4 %   Monocytes Absolute 0.7 0.1 - 1.0 K/uL   Eosinophils Relative 0 %   Eosinophils Absolute 0.0 0.0 - 0.7 K/uL   Basophils Relative 0 %   Basophils Absolute 0.0 0.0 - 0.1 K/uL  Protime-INR     Status: None   Collection Time: 06/06/17  6:03 PM  Result Value Ref Range   Prothrombin Time 14.1 11.4 - 15.2 seconds   INR 1.10   Type and screen [place]     Status: None (Preliminary result)   Collection Time: 06/06/17  6:14 PM  Result Value Ref Range   ABO/RH(D) A POS    Antibody Screen PENDING    Sample Expiration 06/09/2017    Ct Maxillofacial Wo Contrast  Result Date: 06/06/2017 CLINICAL DATA:  Syncopal episode after getting a tetanus shot, fell striking chin, laceration, facial trauma EXAM: CT MAXILLOFACIAL WITHOUT CONTRAST TECHNIQUE: Multidetector CT imaging of the maxillofacial structures was performed. Multiplanar CT image reconstructions were also generated. Right side of face marked with BB. COMPARISON:  None. FINDINGS: Osseous: Multiple facial bone fractures: BILATERAL displaced mandibular condyle fractures. Nondisplaced  fracture of mentum of the mandible extending into the bases of teeth 25 and 26. Minimally displaced fractures of the anterior and lateral walls of BILATERAL maxillary sinuses. Fracture of nasal septum, which is deviated to the RIGHT. BILATERAL maxillary fractures extending into BILATERAL pterygoid plates and into hard palate on LEFT. Orbits, zygomas, nasal bones, and visualized cervical spine intact. Orbits: Intraorbital soft tissue planes clear bilaterally Sinuses: Air-fluid levels in the maxillary sinuses bilaterally, fluid high attenuation consistent with blood. Soft tissues: Soft tissue swelling anterior inferior to the mentum of the mandible. Prevertebral soft tissues normal thickness. Limited intracranial: Unremarkable IMPRESSION: BILATERAL displaced mandibular condyle fractures. Nondisplaced fracture of the mandibular mentum. Multiple facial bone fractures as above compatible with BILATERAL LeFort 1 fractures. Electronically Signed   By: Lavonia Dana M.D.   On: 06/06/2017 17:49    Review of Systems  Neurological: Positive for headaches.  All other systems reviewed and are negative.   Blood pressure 129/80, pulse (!) 117, resp. rate (!) 9, SpO2 100 %. Physical Exam  Constitutional: He is oriented to person, place, and time. He appears well-developed and well-nourished. No distress.  HENT:  Head: Normocephalic.  Right Ear: External ear normal.  Left Ear: External ear normal.  Nose: Nose normal.  Mouth/Throat: Oropharynx is clear and moist.  Midface slightly mobile on both sides.  Slight widening between upper central incisors.  Unable to close molars together.  Midline of mandible with mucosal defect and malpositioned right lower incisors.  Wire retainer on lingual surface of incisors.  Tender at both TMJ regions.  Chin with horizontal laceration closed with sutures.  Eyes: Pupils are equal, round, and reactive to light. Conjunctivae and EOM are normal.  Neck: Neck supple.  Did not assess  range of motion.  Cardiovascular: Normal rate.   Respiratory: Effort normal.  Musculoskeletal: Normal range of motion.  Neurological: He is alert and oriented to person, place, and time. No cranial nerve deficit.  Skin: Skin is warm and dry.  Psychiatric: He has a normal mood and affect. His behavior is normal. Judgment and thought content normal.     Assessment/Plan Bilateral LeFort 1 and midline symphyseal and bilateral subcondylar mandible fractures  The chin laceration was closed by the ER staff.  I personally reviewed his CT scan results and discussed  his situation with the patient and his parents, by phone.  With malocclusion and an unstable midface, I recommended proceeding with ORIF of the bilateral LeFort fracture and maxillomandibular fixation to stabilize the mandible.  I will try to wire the loose teeth into position but I explained that he may need further orthodontia in the future.  Bilateral subcondylar fractures can result in abnormal joint function after healing and this was explained.  I will plan to convert from wire fixation to guiding bands after 2-4 weeks.  Prior to proceeding to surgery, I asked the ER staff to clear his cervical spine.  Melida Quitter, MD 06/06/2017, 8:12 PM

## 2017-06-06 NOTE — Anesthesia Preprocedure Evaluation (Addendum)
Anesthesia Evaluation  Patient identified by MRN, date of birth, ID band Patient awake    Reviewed: Allergy & Precautions, NPO status , Patient's Chart, lab work & pertinent test results  Airway Mallampati: IV  TM Distance: >3 FB Neck ROM: Full  Mouth opening: Limited Mouth Opening Comment: Unable to open mouth due to pain/mandible fx Dental no notable dental hx. (+) Teeth Intact, Dental Advisory Given,    Pulmonary neg pulmonary ROS,    Pulmonary exam normal breath sounds clear to auscultation       Cardiovascular negative cardio ROS Normal cardiovascular exam Rhythm:Regular Rate:Normal  ECG: SR, rate 70   Neuro/Psych negative neurological ROS  negative psych ROS   GI/Hepatic negative GI ROS, Neg liver ROS,   Endo/Other  negative endocrine ROS  Renal/GU negative Renal ROS     Musculoskeletal negative musculoskeletal ROS (+)   Abdominal   Peds  Hematology negative hematology ROS (+)   Anesthesia Other Findings   Reproductive/Obstetrics                            Anesthesia Physical Anesthesia Plan  ASA: II and emergent  Anesthesia Plan: General   Post-op Pain Management:    Induction: Intravenous  PONV Risk Score and Plan: 2 and Ondansetron, Dexamethasone and Midazolam  Airway Management Planned: Nasal ETT and Fiberoptic Intubation Planned  Additional Equipment:   Intra-op Plan:   Post-operative Plan: Extubation in OR and Possible Post-op intubation/ventilation  Informed Consent: I have reviewed the patients History and Physical, chart, labs and discussed the procedure including the risks, benefits and alternatives for the proposed anesthesia with the patient or authorized representative who has indicated his/her understanding and acceptance.   Dental advisory given  Plan Discussed with: CRNA  Anesthesia Plan Comments:        Anesthesia Quick Evaluation

## 2017-06-07 DIAGNOSIS — S02609A Fracture of mandible, unspecified, initial encounter for closed fracture: Secondary | ICD-10-CM | POA: Diagnosis present

## 2017-06-07 LAB — TYPE AND SCREEN
ABO/RH(D): A POS
Antibody Screen: NEGATIVE

## 2017-06-07 LAB — ABO/RH: ABO/RH(D): A POS

## 2017-06-07 MED ORDER — MORPHINE SULFATE (PF) 4 MG/ML IV SOLN: 2.0000 mg | INTRAVENOUS | Status: DC | PRN

## 2017-06-07 MED ORDER — CLINDAMYCIN PHOSPHATE 600 MG/50ML IV SOLN
600.0000 mg | Freq: Three times a day (TID) | INTRAVENOUS | Status: DC
Start: 2017-06-07 — End: 2017-06-07
  Administered 2017-06-07: 600 mg via INTRAVENOUS
  Filled 2017-06-07 (×3): qty 50

## 2017-06-07 MED ORDER — KCL IN DEXTROSE-NACL 20-5-0.45 MEQ/L-%-% IV SOLN
INTRAVENOUS | Status: DC
  Administered 2017-06-07: 02:00:00 via INTRAVENOUS

## 2017-06-07 MED ORDER — ONDANSETRON HCL 4 MG/2ML IJ SOLN: 4.0000 mg | INTRAMUSCULAR | Status: DC | PRN

## 2017-06-07 MED ORDER — HYDROCODONE-ACETAMINOPHEN 7.5-325 MG/15ML PO SOLN
10.0000 mL | ORAL | Status: DC | PRN
  Administered 2017-06-07 (×2): 15 mL via ORAL
  Filled 2017-06-07 (×2): qty 15

## 2017-06-07 MED ORDER — HYDROCODONE-ACETAMINOPHEN 7.5-325 MG/15ML PO SOLN: 10.0000 mL | ORAL | 0 refills | Status: DC | PRN

## 2017-06-07 MED ORDER — CLINDAMYCIN PHOSPHATE 600 MG/50ML IV SOLN
INTRAVENOUS | Status: AC
  Administered 2017-06-07: 600 mg via INTRAVENOUS
  Filled 2017-06-07: qty 50

## 2017-06-07 MED ORDER — ONDANSETRON HCL 4 MG PO TABS: 4.0000 mg | ORAL_TABLET | ORAL | Status: DC | PRN

## 2017-06-07 MED ORDER — BACITRACIN ZINC 500 UNIT/GM EX OINT
1.0000 "application " | TOPICAL_OINTMENT | Freq: Three times a day (TID) | CUTANEOUS | Status: DC
  Administered 2017-06-07 (×2): 1 via TOPICAL
  Filled 2017-06-07: qty 28.35

## 2017-06-07 MED ORDER — ONDANSETRON HCL 4 MG/2ML IJ SOLN
INTRAMUSCULAR | Status: DC | PRN
  Administered 2017-06-07: 4 mg via INTRAVENOUS

## 2017-06-07 MED ORDER — MORPHINE SULFATE (PF) 2 MG/ML IV SOLN: 2.0000 mg | INTRAVENOUS | Status: DC | PRN

## 2017-06-07 MED ORDER — KCL IN DEXTROSE-NACL 20-5-0.45 MEQ/L-%-% IV SOLN
INTRAVENOUS | Status: AC
  Filled 2017-06-07: qty 1000

## 2017-06-07 NOTE — Transfer of Care (Signed)
Immediate Anesthesia Transfer of Care Note  Patient: Carl Terry  Procedure(s) Performed: Procedure(s): OPEN REDUCTION INTERNAL FIXATION (ORIF) MANDIBLE SYMPHYSIS FRACTURE, BILATERAL LEFORT FRACTURES, MAXILLAR-MANDIBULAR FIXATION (Bilateral)  Patient Location: PACU  Anesthesia Type:General  Level of Consciousness: awake, sedated and patient cooperative  Airway & Oxygen Therapy: Patient connected to nasal cannula oxygen  Post-op Assessment: Report given to RN and Post -op Vital signs reviewed and stable  Post vital signs: Reviewed and stable  Last Vitals:  Vitals:   06/06/17 2030 06/06/17 2100  BP: 132/82 125/73  Pulse: (!) 115 84  Resp: 13 13  SpO2: 100% 100%    Last Pain:  Vitals:   06/06/17 1849  PainSc: 2          Complications: No apparent anesthesia complications

## 2017-06-07 NOTE — Anesthesia Postprocedure Evaluation (Signed)
Anesthesia Post Note  Patient: Doren CustardBrandon Bulkley  Procedure(s) Performed: Procedure(s) (LRB): OPEN REDUCTION INTERNAL FIXATION (ORIF) MANDIBLE SYMPHYSIS FRACTURE, BILATERAL LEFORT FRACTURES, MAXILLAR-MANDIBULAR FIXATION (Bilateral)     Patient location during evaluation: PACU Anesthesia Type: General Level of consciousness: awake Pain management: pain level controlled Vital Signs Assessment: post-procedure vital signs reviewed and stable Respiratory status: spontaneous breathing, nonlabored ventilation, respiratory function stable and patient connected to nasal cannula oxygen Cardiovascular status: blood pressure returned to baseline and stable Postop Assessment: no signs of nausea or vomiting Anesthetic complications: no    Last Vitals:  Vitals:   06/07/17 0138 06/07/17 0210  BP: 134/78 (!) 130/37  Pulse: 87 80  Resp: 13 13  Temp: 37.1 C 37.4 C  SpO2: 100% 100%    Last Pain:  Vitals:   06/07/17 0210  TempSrc: Axillary  PainSc:                  Catheryn Baconyan P Ellender

## 2017-06-07 NOTE — Brief Op Note (Signed)
06/06/2017 - 06/07/2017  12:29 AM  PATIENT:  Doren CustardBrandon Reddish  26 y.o. male  PRE-OPERATIVE DIAGNOSIS:  Bilateral LeFort 1 fractures, mandible symphysis fracture, bilateral subcondylar mandible fractures  POST-OPERATIVE DIAGNOSIS:  Same  PROCEDURE:  Procedure(s): OPEN REDUCTION INTERNAL FIXATION (ORIF) MANDIBLE SYMPHYSIS FRACTURE, BILATERAL LEFORT FRACTURES, MAXILLAR-MANDIBULAR FIXATION (Bilateral)  SURGEON:  Surgeon(s) and Role:    Christia Reading* Vala Raffo, MD - Primary  PHYSICIAN ASSISTANT:   ASSISTANTS: none   ANESTHESIA:   general  EBL:  Total I/O In: 1800 [I.V.:1200; IV Piggyback:600] Out: 575 [Urine:575]  BLOOD ADMINISTERED:none  DRAINS: none   LOCAL MEDICATIONS USED:  LIDOCAINE   SPECIMEN:  No Specimen  DISPOSITION OF SPECIMEN:  N/A  COUNTS:  YES  TOURNIQUET:  * No tourniquets in log *  DICTATION: .Other Dictation: Dictation Number [id number]  PLAN OF CARE: Admit for overnight observation  PATIENT DISPOSITION:  PACU - hemodynamically stable.   Delay start of Pharmacological VTE agent (>24hrs) due to surgical blood loss or risk of bleeding: no

## 2017-06-07 NOTE — Discharge Summary (Signed)
Physician Discharge Summary  Patient ID: Carl Terry MRN: [id number] DOB/AGE: 05/30/1991 26 y.o.  Admit date: 06/06/2017 Discharge date: 06/07/2017  Admission Diagnoses: Mandible and midface fractures, chin laceration  Discharge Diagnoses:  Active Problems:   Mandible fracture [facility](HCC) Midface fractures Chin laceration  Discharged Condition: good  Hospital Course: 26 year old male struck his chin when he passed out after a vaccination.  Sustained chin laceration and bilateral subcondylar, symphyseal, and bilateral LeFort 1 fractures.  Underwent surgical repair.  See operative note.  He was observed overnight after surgery and did well.  Pain is well controlled.  Mandibular fixation is solid.  He was felt stable for discharge.  Consults: None  Significant Diagnostic Studies: None  Treatments: surgery: ORIF bilateral LeFort 1 fractures, ORIF symphyseal mandible fracture, maxillomandibular fixation  Discharge Exam: Blood pressure (!) 128/57, pulse 88, temperature 99.3 F (37.4 C), temperature source Axillary, resp. rate 13, SpO2 100 %. General appearance: alert, cooperative and no distress Head: Chin laceration clean and intact.  Maxillomandibular fixation solid, normal occlusion.  Disposition: Final discharge disposition not confirmed  Discharge Instructions    Diet - low sodium heart healthy    Complete by:  As directed    Discharge instructions    Complete by:  As directed    Avoid strenuous activity.  Full liquid diet.  Rinse with salt water after meals and at bedtime.  May clean teeth with water pick.  Keep wire cutters with you at all times.  Cut wire loops only if you have to (if you need to vomit) and then call Dr. Jenne PaneBates right away.  Apply antibiotic ointment to chin laceration twice daily.   Increase activity slowly    Complete by:  As directed      Allergies as of 06/07/2017   No Known Allergies     Medication List    TAKE these medications    HYDROcodone-acetaminophen 7.5-325 mg/15 ml solution Commonly known as:  HYCET Take 10-15 mLs by mouth every 4 (four) hours as needed for moderate pain.            Discharge Care Instructions        Start     Ordered   06/07/17 0000  HYDROcodone-acetaminophen (HYCET) 7.5-325 mg/15 ml solution  Every 4 hours PRN     06/07/17 1457   06/07/17 0000  Increase activity slowly     06/07/17 1457   06/07/17 0000  Diet - low sodium heart healthy     06/07/17 1457   06/07/17 0000  Discharge instructions    Comments:  Avoid strenuous activity.  Full liquid diet.  Rinse with salt water after meals and at bedtime.  May clean teeth with water pick.  Keep wire cutters with you at all times.  Cut wire loops only if you have to (if you need to vomit) and then call Dr. Jenne PaneBates right away.  Apply antibiotic ointment to chin laceration twice daily.   06/07/17 1457     Follow-up Information    Christia ReadingBates, Thurlow Gallaga, MD. Schedule an appointment as soon as possible for a visit on 06/12/2017.   Specialty:  Otolaryngology Contact information: [address] [place] [address] [phone]           Signed: Christia ReadingBATES, D'Arcy Abraha 06/07/2017, 2:57 PM

## 2017-06-07 NOTE — Discharge Instructions (Signed)
Bone Grafting, Adult, Care After °This sheet gives you information about how to care for yourself after your procedure. Your health care provider may also give you more specific instructions. If you have problems or questions, contact your health care provider. °What can I expect after the procedure? °After the procedure, it is common to have: °· Pain. °· Bruising. °· Swelling. °· Stiffness. ° °Follow these instructions at home: °If you have a splint or brace: °· Wear the splint or brace as told by your health care provider. Remove it only as told by your health care provider. °· Loosen the splint or brace if your fingers or toes tingle, become numb, or turn cold and blue. °· Do not let your splint or brace get wet if it is not waterproof. °· Keep the splint or brace clean. °If you have a cast: °· Do not stick anything inside the cast to scratch your skin. Doing that increases your risk of infection. °· Check the skin around the cast every day. Tell your health care provider about any concerns. °· You may put lotion on dry skin around the edges of the cast. Do not put lotion on the skin underneath the cast. °· Do not let your cast get wet if it is not waterproof. °· Keep the cast clean. °Bathing °· Do not take baths, swim, or use a hot tub until your health care provider approves. Ask your health care provider if you can take showers. You may only be allowed to take sponge baths for bathing. °· If your cast or splint is not waterproof, cover it with a watertight covering when you take a bath or a shower. °· Keep the bandage (dressing) dry until your health care provider says it can be removed. °Incision care °· Follow instructions from your health care provider about how to take care of your incision. Make sure you: °? Wash your hands with soap and water before you change your bandage (dressing). If soap and water are not available, use hand sanitizer. °? Change your dressing as told by your health care  provider. °? Leave stitches (sutures), skin glue, or adhesive strips in place. These skin closures may need to stay in place for 2 weeks or longer. If adhesive strip edges start to loosen and curl up, you may trim the loose edges. Do not remove adhesive strips completely unless your health care provider tells you to do that. °· Check your incision area every day for signs of infection. Watch for: °? More redness, swelling, or pain. °? More fluid or blood. °? Warmth. °? Pus or a bad smell. °Managing pain, stiffness, and swelling °· If directed, apply ice to the injured area: °? If you have a removable splint or brace, remove it as told by your health care provider °? Put ice in a plastic bag. °? Place a towel between your skin and the bag or between your cast and the bag. °? Leave the ice on for 20 minutes, 2-3 times per day. °· Move your fingers or toes often to avoid stiffness and to lessen swelling. °· Raise (elevate) the injured area above the level of your heart while you are sitting or lying down. °Driving °· Do not drive or use heavy machinery while taking prescription pain medicine. °· Do not drive for 24 hours if you were given a medicine to help you relax (sedative). °· Ask your health care provider when it is safe to drive if you have a cast or splint   on your arm, hand, foot, or leg. General instructions  Return to your normal activities as told by your health care provider. Ask your health care provider what activities are safe for you.  Do not put pressure on any part of the cast or splint until it is fully hardened. This may take several hours.  Do not use any products that contain nicotine or tobacco, such as cigarettes and e-cigarettes. If you need help quitting, ask your health care provider.  Take over-the-counter and prescription medicines only as told by your health care provider.  Keep all follow-up visits as told by your health care provider. This is important. Contact a health care  provider if:  You have chills or a fever.  Your pain medicine is not helping.  You have more redness, swelling, or pain around the incision.  You have more fluid or blood coming from the incision.  Your incision feels warm to the touch.  You have pus or a bad smell coming from the incision.  Your cast, splint, or brace: ? Is too loose or too tight. ? Gets damaged. Get help right away if:  You have pain or swelling that gets worse.  The back of your lower leg (calf) gets red, warm, painful, or swollen.  You have chest pain.  You have trouble breathing.  You have numbness or tingling. This information is not intended to replace advice given to you by your health care provider. Make sure you discuss any questions you have with your health care provider. Document Released: 01/30/2015 Document Revised: 06/10/2016 Document Reviewed: 06/10/2016 Elsevier Interactive Patient Education  2018 [employer]   Facial Laceration A facial laceration is a cut on the face. These injuries can be painful and cause bleeding. Some cuts may need to be closed with stitches (sutures), skin adhesive strips, or wound glue. Cuts usually heal quickly but can leave a scar. It can take 1-2 years for the scar to go away completely. Follow these instructions at home:  Only take medicines as told by your doctor.  Follow your doctor's instructions for wound care. For Stitches:  Keep the cut clean and dry.  If you have a bandage (dressing), change it at least once a day. Change the bandage if it gets wet or dirty, or as told by your doctor.  Wash the cut with soap and water 2 times a day. Rinse the cut with water. Pat it dry with a clean towel.  Put a thin layer of medicated cream on the cut as told by your doctor.  You may shower after the first 24 hours. Do not soak the cut in water until the stitches are removed.  Have your stitches removed as told by your doctor.  Do not wear any makeup until  a few days after your stitches are removed. For Skin Adhesive Strips:  Keep the cut clean and dry.  Do not get the strips wet. You may take a bath, but be careful to keep the cut dry.  If the cut gets wet, pat it dry with a clean towel.  The strips will fall off on their own. Do not remove the strips that are still stuck to the cut. For Wound Glue:  You may shower or take baths. Do not soak or scrub the cut. Do not swim. Avoid heavy sweating until the glue falls off on its own. After a shower or bath, pat the cut dry with a clean towel.  Do not put medicine or  makeup on your cut until the glue falls off.  If you have a bandage, do not put tape over the glue.  Avoid lots of sunlight or tanning lamps until the glue falls off.  The glue will fall off on its own in 5-10 days. Do not pick at the glue. After Healing:  Put sunscreen on the cut for the first year to reduce your scar. Contact a doctor if:  You have a fever. Get help right away if:  Your cut area gets red, painful, or puffy (swollen).  You see a yellowish-white fluid (pus) coming from the cut. This information is not intended to replace advice given to you by your health care provider. Make sure you discuss any questions you have with your health care provider. Document Released: 03/03/2008 Document Revised: 02/21/2016 Document Reviewed: 04/28/2013 Elsevier Interactive Patient Education  2017 [employer]      Post Anesthesia Home Care Instructions  Activity: Get plenty of rest for the remainder of the day. A responsible individual must stay with you for 24 hours following the procedure.  For the next 24 hours, DO NOT: -Drive a car -Advertising copywriter -Drink alcoholic beverages -Take any medication unless instructed by your physician -Make any legal decisions or sign important papers.  Meals: Start with liquid foods such as gelatin or soup. Progress to regular foods as tolerated. Avoid greasy, spicy, heavy  foods. If nausea and/or vomiting occur, drink only clear liquids until the nausea and/or vomiting subsides. Call your physician if vomiting continues.  Special Instructions/Symptoms: Your throat may feel dry or sore from the anesthesia or the breathing tube placed in your throat during surgery. If this causes discomfort, gargle with warm salt water. The discomfort should disappear within 24 hours.  If you had a scopolamine patch placed behind your ear for the management of post- operative nausea and/or vomiting:  1. The medication in the patch is effective for 72 hours, after which it should be removed.  Wrap patch in a tissue and discard in the trash. Wash hands thoroughly with soap and water. 2. You may remove the patch earlier than 72 hours if you experience unpleasant side effects which may include dry mouth, dizziness or visual disturbances. 3. Avoid touching the patch. Wash your hands with soap and water after contact with the patch.

## 2017-06-08 ENCOUNTER — Encounter (HOSPITAL_COMMUNITY): Payer: Self-pay | Admitting: Otolaryngology

## 2017-06-08 NOTE — Op Note (Signed)
NAME:  Doren CustardOZIL, [place]                    ACCOUNT NO.:  MEDICAL RECORD NO.:  [id number]  LOCATION:                                 FACILITY:  PHYSICIAN:  Antony Contraswight D Zen Cedillos, MD     DATE OF BIRTH:  07-21-1991  DATE OF PROCEDURE:  06/06/2017 DATE OF DISCHARGE:                              OPERATIVE REPORT   PREOPERATIVE DIAGNOSES: 1. Bilateral LeFort 1 fractures. 2. Mandible symphysis fracture. 3. Bilateral subcondylar mandible fractures.  POSTOPERATIVE DIAGNOSES: 1. Bilateral LeFort 1 fractures. 2. Mandible symphysis fracture. 3. Bilateral subcondylar mandible fractures.  PROCEDURES: 1. Open reduction and internal fixation of bilateral LeFort 1     fractures, open reduction and internal fixation of symphyseal     mandible fracture. 2. Maxillomandibular fixation.  SURGEON:  Excell SeltzerDwight D. Jenne PaneBates, MD  ANESTHESIA:  General endotracheal anesthesia.  COMPLICATIONS:  None.  INDICATION:  The patient is a 26 year old male, who passed out earlier today after receiving a vaccination and struck his chin against the floor resulting in a laceration.  He was brought to the emergency department, where ultimately CT imaging demonstrated the above fractures as well.  The chin laceration was closed in the emergency department. He presents to the operating room for surgical management.  FINDINGS:  Occlusion was abnormal at the start of the case.  There was a little bit of widening between the upper central incisors consistent with the palatal fracture.  This was reduced with the upper arch bar. The medial and lateral buttresses were fractured on both sides, and this was found by exposing the maxilla on both sides.  There were 2 loose incisors inferiorly on the right side that were found to be part of a loose alveolar ridge segment in continuity with the symphyseal fracture. The lateral incisor was totally loose from the fragments.  Reduction of the symphyseal fracture included fixation of the  alveolar ridge fragment.  The lateral incisor was reimplanted prior to fixating the maxillomandibular fixation.  Normal-looking occlusion was achieved in that process.  DESCRIPTION OF PROCEDURE:  The patient was identified in the holding room, and informed consent having been obtained including discussion of risks, benefits, and alternatives, the patient was brought to the operative suite and put on the operative table in a supine position. Anesthesia was induced, and the patient was intubated via nasotracheal approach by the Anesthesia Team without difficulty.  The eyes were taped closed, and the bed was turned 90 degrees from Anesthesia.  The lower face and neck were prepped and draped in a sterile fashion.  The superior and inferior gingivobuccal sulcus was injected with local anesthetic.  The superior gingivobuccal incisions were made first on the left and then on the right using Bovie electrocautery keeping the midline intact.  Soft tissues were then elevated off the maxilla using the elevators exposing the maxilla up to the infraorbital nerve on both sides.  The nerve was kept intact.  This allowed exposure of the medial and lateral buttress on each side, and the fractures were fully exposed. The lower teeth were then examined and the decision made to open the symphyseal fracture.  The gingivobuccal sulcus incision was made  with the Bovie electrocautery and the soft tissues were divided down to the mandible.  The soft tissues were elevated off the bone using elevators fully exposing the fracture.  The fracture was distracted to allow suction of debris from within the fracture line.  The upper teeth were then examined and the gap between the central incisors was able to be reduced just by pushing the teeth together of the palate.  This was then fixated by placing the upper arch bar, and using the first wires around the canine tooth on each side and tightening them down.  This  reduced the palate and bringing the teeth together.  The premolar and molar teeth were then also used with wires to secure the arch bar fully. Attention was then directed to the maxilla starting on the left side where the lateral and medial buttress fractures were in good position and were secured with 4 hole plates with a 1.7 mm Leibinger set using 4 mm screws.  A couple of emergency screw was required laterally.  The medial buttress on the left side was likewise fixated.  The lateral buttress was left unfixated at this point.  The symphyseal fracture was then inspected.  Pilot holes were then drilled to either side of the fracture to allow placement of the bone reduction forceps.  These were then used to reduce the fracture and it was well reduced with this process.  A 5-hole plate from the 1.7 mm Leibinger set was then placed across the superior extent of the symphyseal fracture to encompass the alveolar ridge segment.  This was secured with 4 mm screws.  The lower arch bar was then placed and secured using wires looped around the premolars and molars.  The lower right lateral incisor was loose and actually came out during placement of the arch bar.  It was then reimplanted at this point.  The mandible was then brought up into occlusion with the maxilla and fixation was then performed using loops of wire between these upper and lower arch bars on each side totaling 4 loops.  At this point, the lateral buttress on the right was secured with an L-shaped plate from the 1.7 mm Leibinger set and 4 mm screws. At this point, the incisions were all closed with 3-0 Monocryl in a simple running fashion.  The patient was then cleaned off and returned to anesthesia for wake up.  He was extubated in the recovery room in stable condition.     Antony Contras, MD     DDB/MEDQ  D:  06/07/2017  T:  06/07/2017  Job:  [id number]  cc:   Chart Antony Contras, MD

## 2017-08-17 ENCOUNTER — Other Ambulatory Visit: Payer: Self-pay | Admitting: Otolaryngology

## 2017-08-17 ENCOUNTER — Encounter (HOSPITAL_BASED_OUTPATIENT_CLINIC_OR_DEPARTMENT_OTHER): Payer: Self-pay | Admitting: *Deleted

## 2017-08-17 ENCOUNTER — Other Ambulatory Visit: Payer: Self-pay

## 2017-08-18 ENCOUNTER — Encounter (HOSPITAL_BASED_OUTPATIENT_CLINIC_OR_DEPARTMENT_OTHER)
Admission: RE | Disposition: A | Discharge status: Home / Self Care | Payer: Self-pay | Source: Ambulatory Visit | Attending: Otolaryngology

## 2017-08-18 ENCOUNTER — Ambulatory Visit (HOSPITAL_BASED_OUTPATIENT_CLINIC_OR_DEPARTMENT_OTHER): Payer: BLUE CROSS/BLUE SHIELD | Admitting: Anesthesiology

## 2017-08-18 ENCOUNTER — Ambulatory Visit (HOSPITAL_BASED_OUTPATIENT_CLINIC_OR_DEPARTMENT_OTHER)
Admission: RE | Admit: 2017-08-18 | Discharge: 2017-08-18 | Disposition: A | Discharge status: Home / Self Care | Payer: BLUE CROSS/BLUE SHIELD | Source: Ambulatory Visit | Attending: Otolaryngology | Admitting: Otolaryngology

## 2017-08-18 ENCOUNTER — Encounter (HOSPITAL_BASED_OUTPATIENT_CLINIC_OR_DEPARTMENT_OTHER): Payer: Self-pay | Admitting: Anesthesiology

## 2017-08-18 DIAGNOSIS — W010XXD Fall on same level from slipping, tripping and stumbling without subsequent striking against object, subsequent encounter: Secondary | ICD-10-CM | POA: Diagnosis not present

## 2017-08-18 DIAGNOSIS — S02609D Fracture of mandible, unspecified, subsequent encounter for fracture with routine healing: Secondary | ICD-10-CM | POA: Diagnosis present

## 2017-08-18 HISTORY — PX: MANDIBULAR HARDWARE REMOVAL: SHX5205

## 2017-08-18 HISTORY — DX: Fracture of mandible, unspecified, initial encounter for closed fracture: S02.609A

## 2017-08-18 HISTORY — DX: Dental restoration status: Z98.811

## 2017-08-18 HISTORY — DX: Cardiac murmur, unspecified: R01.1

## 2017-08-18 SURGERY — REMOVAL, HARDWARE, MANDIBLE
Anesthesia: General | Site: Mouth

## 2017-08-18 MED ORDER — SCOPOLAMINE 1 MG/3DAYS TD PT72: 1.0000 | MEDICATED_PATCH | Freq: Once | TRANSDERMAL | Status: DC | PRN

## 2017-08-18 MED ORDER — LIDOCAINE 2% (20 MG/ML) 5 ML SYRINGE
INTRAMUSCULAR | Status: AC
  Filled 2017-08-18: qty 5

## 2017-08-18 MED ORDER — FENTANYL CITRATE (PF) 100 MCG/2ML IJ SOLN
50.0000 ug | INTRAMUSCULAR | Status: DC | PRN
  Administered 2017-08-18: 100 ug via INTRAVENOUS

## 2017-08-18 MED ORDER — FENTANYL CITRATE (PF) 100 MCG/2ML IJ SOLN
INTRAMUSCULAR | Status: AC
  Filled 2017-08-18: qty 2

## 2017-08-18 MED ORDER — PROMETHAZINE HCL 25 MG/ML IJ SOLN: 6.2500 mg | INTRAMUSCULAR | Status: DC | PRN

## 2017-08-18 MED ORDER — MIDAZOLAM HCL 2 MG/2ML IJ SOLN
1.0000 mg | INTRAMUSCULAR | Status: DC | PRN
  Administered 2017-08-18: 2 mg via INTRAVENOUS

## 2017-08-18 MED ORDER — ONDANSETRON HCL 4 MG/2ML IJ SOLN
INTRAMUSCULAR | Status: DC | PRN
  Administered 2017-08-18: 4 mg via INTRAVENOUS

## 2017-08-18 MED ORDER — BACITRACIN ZINC 500 UNIT/GM EX OINT
TOPICAL_OINTMENT | CUTANEOUS | Status: AC
  Filled 2017-08-18: qty 1.8

## 2017-08-18 MED ORDER — LIDOCAINE-EPINEPHRINE 1 %-1:100000 IJ SOLN
INTRAMUSCULAR | Status: AC
  Filled 2017-08-18: qty 1

## 2017-08-18 MED ORDER — FENTANYL CITRATE (PF) 100 MCG/2ML IJ SOLN: 25.0000 ug | INTRAMUSCULAR | Status: DC | PRN

## 2017-08-18 MED ORDER — LACTATED RINGERS IV SOLN
INTRAVENOUS | Status: DC
  Administered 2017-08-18: 11:00:00 via INTRAVENOUS

## 2017-08-18 MED ORDER — DEXAMETHASONE SODIUM PHOSPHATE 4 MG/ML IJ SOLN
INTRAMUSCULAR | Status: DC | PRN
  Administered 2017-08-18: 10 mg via INTRAVENOUS

## 2017-08-18 MED ORDER — BACITRACIN 500 UNIT/GM EX OINT
TOPICAL_OINTMENT | CUTANEOUS | Status: DC | PRN
  Administered 2017-08-18: 1 via TOPICAL

## 2017-08-18 MED ORDER — DEXAMETHASONE SODIUM PHOSPHATE 10 MG/ML IJ SOLN
INTRAMUSCULAR | Status: AC
  Filled 2017-08-18: qty 1

## 2017-08-18 MED ORDER — MIDAZOLAM HCL 2 MG/2ML IJ SOLN
INTRAMUSCULAR | Status: AC
Start: 2017-08-18 — End: ?
  Filled 2017-08-18: qty 2

## 2017-08-18 MED ORDER — ONDANSETRON HCL 4 MG/2ML IJ SOLN
INTRAMUSCULAR | Status: AC
Start: 2017-08-18 — End: ?
  Filled 2017-08-18: qty 2

## 2017-08-18 SURGICAL SUPPLY — 28 items
BLADE SURG 15 STRL LF DISP TIS (BLADE) ×1 IMPLANT
BLADE SURG 15 STRL SS (BLADE) ×2
CANISTER SUCT 1200ML W/VALVE (MISCELLANEOUS) ×3 IMPLANT
COVER MAYO STAND STRL (DRAPES) IMPLANT
DECANTER SPIKE VIAL GLASS SM (MISCELLANEOUS) ×3 IMPLANT
ELECT COATED BLADE 2.86 ST (ELECTRODE) IMPLANT
ELECT REM PT RETURN 9FT ADLT (ELECTROSURGICAL)
ELECTRODE REM PT RTRN 9FT ADLT (ELECTROSURGICAL) IMPLANT
GAUZE SPONGE 4X4 12PLY STRL LF (GAUZE/BANDAGES/DRESSINGS) IMPLANT
GAUZE SPONGE 4X4 16PLY XRAY LF (GAUZE/BANDAGES/DRESSINGS) IMPLANT
GLOVE BIO SURGEON STRL SZ7.5 (GLOVE) ×3 IMPLANT
GLOVE BIOGEL PI IND STRL 7.0 (GLOVE) ×1 IMPLANT
GLOVE BIOGEL PI INDICATOR 7.0 (GLOVE) ×2
GLOVE ECLIPSE 6.5 STRL STRAW (GLOVE) ×3 IMPLANT
NEEDLE PRECISIONGLIDE 27X1.5 (NEEDLE) ×3 IMPLANT
NS IRRIG 1000ML POUR BTL (IV SOLUTION) IMPLANT
PACK BASIN DAY SURGERY FS (CUSTOM PROCEDURE TRAY) IMPLANT
PENCIL BUTTON HOLSTER BLD 10FT (ELECTRODE) IMPLANT
SCISSORS WIRE ANG 4 3/4 DISP (INSTRUMENTS) IMPLANT
SHEET MEDIUM DRAPE 40X70 STRL (DRAPES) ×3 IMPLANT
SUT MNCRL AB 3-0 PS2 18 (SUTURE) IMPLANT
SYR 20CC LL (SYRINGE) IMPLANT
SYR CONTROL 10ML LL (SYRINGE) ×3 IMPLANT
TOWEL OR 17X24 6PK STRL BLUE (TOWEL DISPOSABLE) ×3 IMPLANT
TRAY DSU PREP LF (CUSTOM PROCEDURE TRAY) IMPLANT
TUBE CONNECTING 20'X1/4 (TUBING) ×1
TUBE CONNECTING 20X1/4 (TUBING) ×2 IMPLANT
YANKAUER SUCT BULB TIP NO VENT (SUCTIONS) ×3 IMPLANT

## 2017-08-18 NOTE — Discharge Instructions (Signed)

## 2017-08-18 NOTE — Brief Op Note (Signed)
08/18/2017  11:18 AM  PATIENT:  Doren CustardBrandon Heskett  26 y.o. male  PRE-OPERATIVE DIAGNOSIS:  mandibular fracture  POST-OPERATIVE DIAGNOSIS:  mandibular fracture  PROCEDURE:  Procedure(s): MANDIBULAR HARDWARE REMOVAL (N/A)  SURGEON:  Surgeon(s) and Role:    Christia Reading* Aradhana Gin, MD - Primary  PHYSICIAN ASSISTANT:   ASSISTANTS: none   ANESTHESIA:   general  EBL:  Minimal  BLOOD ADMINISTERED:none  DRAINS: none   LOCAL MEDICATIONS USED:  NONE  SPECIMEN:  No Specimen  DISPOSITION OF SPECIMEN:  N/A  COUNTS:  YES  TOURNIQUET:  * No tourniquets in log *  DICTATION: .Other Dictation: Dictation Number [id number]  PLAN OF CARE: Discharge to home after PACU  PATIENT DISPOSITION:  PACU - hemodynamically stable.   Delay start of Pharmacological VTE agent (>24hrs) due to surgical blood loss or risk of bleeding: no

## 2017-08-18 NOTE — Transfer of Care (Signed)
Immediate Anesthesia Transfer of Care Note  Patient: Carl CustardBrandon Terry  Procedure(s) Performed: MANDIBULAR HARDWARE REMOVAL (N/A Mouth)  Patient Location: PACU  Anesthesia Type:General  Level of Consciousness: sedated and responds to stimulation  Airway & Oxygen Therapy: Patient Spontanous Breathing and Patient connected to face mask oxygen  Post-op Assessment: Report given to RN and Post -op Vital signs reviewed and stable  Post vital signs: Reviewed and stable  Last Vitals:  Vitals:   08/18/17 1007  BP: 121/65  Pulse: 78  Temp: 36.9 C  SpO2: 100%    Last Pain:  Vitals:   08/18/17 1007  TempSrc: Oral  PainSc: 0-No pain         Complications: No apparent anesthesia complications

## 2017-08-18 NOTE — Anesthesia Postprocedure Evaluation (Signed)
Anesthesia Post Note  Patient: Carl CustardBrandon Terry  Procedure(s) Performed: MANDIBULAR HARDWARE REMOVAL (N/A Mouth)     Patient location during evaluation: PACU Anesthesia Type: General Level of consciousness: awake and alert Pain management: pain level controlled Vital Signs Assessment: post-procedure vital signs reviewed and stable Respiratory status: spontaneous breathing, nonlabored ventilation and respiratory function stable Cardiovascular status: blood pressure returned to baseline and stable Postop Assessment: no apparent nausea or vomiting Anesthetic complications: no    Last Vitals:  Vitals:   08/18/17 1151 08/18/17 1219  BP:  112/78  Pulse: 74 82  Resp: 10 16  Temp:  37.1 C  SpO2: 99% 100%    Last Pain:  Vitals:   08/18/17 1145  TempSrc:   PainSc: 0-No pain                 Cecile HearingStephen Edward Marenda Accardi

## 2017-08-18 NOTE — H&P (Signed)
Carl CustardBrandon Terry is an 26 y.o. male.   Chief Complaint: Mandible fracture HPI: 26 year old male with mandible fracture treated with maxillomandibular fixation.  Wires have been cut and he is doing well.  He presents for removal of mandibular hardware.  Past Medical History:  Diagnosis Date  . Dental crown present   . Heart murmur    states no known problems - no cardiologist  . Mandible fracture (HCC) 06/06/2017    Past Surgical History:  Procedure Laterality Date  . OPEN REDUCTION INTERNAL FIXATION (ORIF) MANDIBLE SYMPHYSIS FRACTURE, BILATERAL LEFORT FRACTURES, MAXILLAR-MANDIBULAR FIXATION Bilateral 06/06/2017   Performed by Christia ReadingBates, Jaquelyne Firkus, MD at [facility]  . WISDOM TOOTH EXTRACTION      History reviewed. No pertinent family history. Social History:  reports that  has never smoked. he has never used smokeless tobacco. He reports that he drinks alcohol. He reports that he does not use drugs.  Allergies: No Known Allergies  No medications prior to admission.    No results found for this or any previous visit (from the past 48 hour(s)). No results found.  Review of Systems  All other systems reviewed and are negative.   Blood pressure 121/65, pulse 78, temperature 98.4 F (36.9 C), temperature source Oral, height 6\' 2"  (1.88 m), weight 135 lb (61.2 kg), SpO2 100 %. Physical Exam  Constitutional: He is oriented to person, place, and time. He appears well-developed and well-nourished. No distress.  HENT:  Head: Normocephalic and atraumatic.  Right Ear: External ear normal.  Left Ear: External ear normal.  Nose: Nose normal.  Mouth/Throat: Oropharynx is clear and moist.  Arch bars in place.  Eyes: Conjunctivae and EOM are normal. Pupils are equal, round, and reactive to light.  Neck: Normal range of motion. Neck supple.  Cardiovascular: Normal rate.  Respiratory: Effort normal.  Musculoskeletal: Normal range of motion.  Neurological: He is alert and oriented to person, place, and  time. No cranial nerve deficit.  Skin: Skin is warm and dry.  Psychiatric: He has a normal mood and affect. His behavior is normal. Judgment and thought content normal.     Assessment/Plan Mandible fracture To OR for removal of mandibular hardware.  Christia ReadingBATES, Laurelin Elson, MD 08/18/2017, 10:53 AM

## 2017-08-18 NOTE — Anesthesia Preprocedure Evaluation (Addendum)
Anesthesia Evaluation  Patient identified by MRN, date of birth, ID band Patient awake    Reviewed: Allergy & Precautions, NPO status , Patient's Chart, lab work & pertinent test results  Airway Mallampati: II  TM Distance: >3 FB Neck ROM: Full  Mouth opening: Limited Mouth Opening  Dental  (+) Teeth Intact, Dental Advisory Given, Loose,    Pulmonary neg pulmonary ROS,    Pulmonary exam normal breath sounds clear to auscultation       Cardiovascular Exercise Tolerance: Good negative cardio ROS Normal cardiovascular exam Rhythm:Regular Rate:Normal     Neuro/Psych negative neurological ROS  negative psych ROS   GI/Hepatic negative GI ROS, Neg liver ROS,   Endo/Other  negative endocrine ROS  Renal/GU negative Renal ROS     Musculoskeletal Mandible fracture s/p ORIF   Abdominal   Peds  Hematology negative hematology ROS (+)   Anesthesia Other Findings Day of surgery medications reviewed with the patient.  Reproductive/Obstetrics                            Anesthesia Physical Anesthesia Plan  ASA: II  Anesthesia Plan: General   Post-op Pain Management:    Induction: Intravenous  PONV Risk Score and Plan: 2 and Midazolam and Ondansetron  Airway Management Planned: Mask  Additional Equipment:   Intra-op Plan:   Post-operative Plan: Extubation in OR  Informed Consent: I have reviewed the patients History and Physical, chart, labs and discussed the procedure including the risks, benefits and alternatives for the proposed anesthesia with the patient or authorized representative who has indicated his/her understanding and acceptance.     Plan Discussed with: CRNA  Anesthesia Plan Comments: (Risks/benefits of general anesthesia discussed with patient including risk of damage to teeth, lips, gum, and tongue, nausea/vomiting, allergic reactions to medications, and the possibility of  heart attack, stroke and death.  All patient questions answered.  Patient wishes to proceed.)        Anesthesia Quick Evaluation

## 2017-08-19 ENCOUNTER — Encounter (HOSPITAL_BASED_OUTPATIENT_CLINIC_OR_DEPARTMENT_OTHER): Payer: Self-pay | Admitting: Otolaryngology

## 2017-08-19 NOTE — Op Note (Signed)
NAME:  Carl Terry, Carl Terry                    ACCOUNT NO.:  MEDICAL RECORD NO.:  [id number]  LOCATION:                                 FACILITY:  PHYSICIAN:  Antony Contraswight D Tamula Morrical, MD     DATE OF BIRTH:  [birth year]  DATE OF PROCEDURE:  08/18/2017 DATE OF DISCHARGE:                              OPERATIVE REPORT   PREOPERATIVE DIAGNOSIS:  Mandible fracture.  POSTOPERATIVE DIAGNOSIS:  Mandible fracture.  PROCEDURE:  Removal of mandibular hardware.  SURGEON:  Antony Contraswight D Hunt Zajicek, MD.  ANESTHESIA:  General mask.  COMPLICATIONS:  None.  INDICATION:  The patient is a 26 year old male, who sustained bilateral mandible and bilateral Le [place] 1 fractures when he passed out from an immunization and hit the ground with his face.  He was treated with open reduction and internal fixation as well as maxillomandibular fixation. His wires were removed now a few weeks ago and he has been doing well. Thus, he presents for removal of the arch bars.  FINDINGS:  Arch bars were in place with wires.  Occlusion is good.  DESCRIPTION OF PROCEDURE:  He was identified in the holding room and an informed consent having been obtained including discussion of risks, benefits, and alternatives, the patient was brought to the operative suite and put on the operative table in supine position.  Anesthesia was induced.  The patient was maintained via mask ventilation. Intermittently, the mass was removed and the wires were unwound and cut and pulled out.  This was done one quadrant at a time with mask ventilation between.  Arch bars were removed in this process.  The mouth was suctioned and he was then returned to anesthesia for wake-up and was moved to the recovery room in stable condition.     Antony Contraswight D Marissa Weaver, MD    DDB/MEDQ  D:  08/18/2017  T:  08/18/2017  Job:  [id number]  cc:   Antony Contraswight D Izabel Chim, MD's office

## 2018-08-18 IMAGING — CT CT HEAD W/O CM
4 of 6 series · 14 of 47 positions shown, 16 images · non-contrast
Comparison: None.

CLINICAL DATA: 26-year-old male with history of syncope. History of
trauma from a fall with injury to the chin.

EXAM:
CT HEAD WITHOUT CONTRAST
CT CERVICAL SPINE WITHOUT CONTRAST
TECHNIQUE: Multidetector CT imaging of the head and cervical spine was
performed following the standard protocol without intravenous
contrast. Multiplanar CT image reconstructions of the cervical spine
were also generated.

[Series 3: head 5.0 h30s · axial · 0.43mm/px · z∈[-95,+20]mm · 5 of 35 slices shown, 7 images]
[im 6/35  brain]
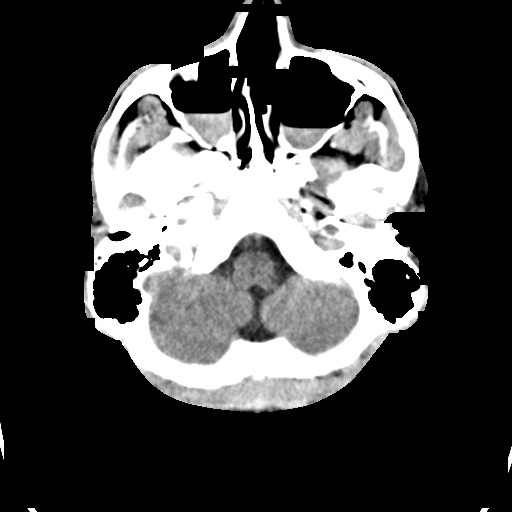
[im 6/35  bone]
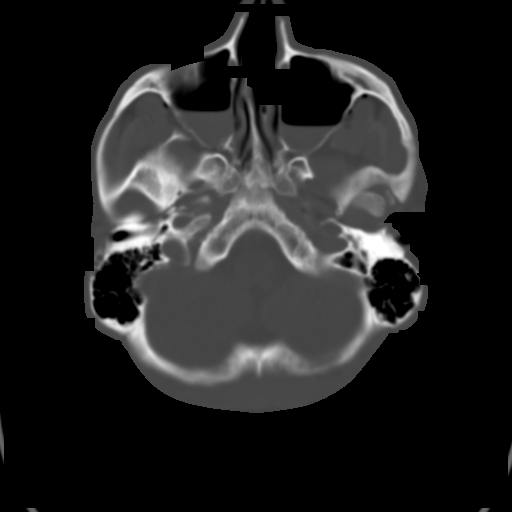
[im 12/35  brain]
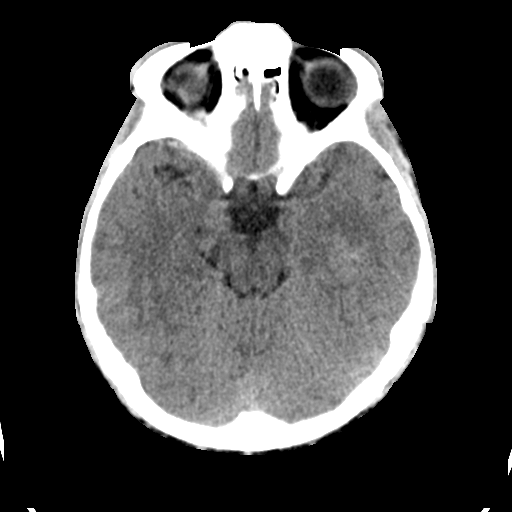
[im 18/35  brain]
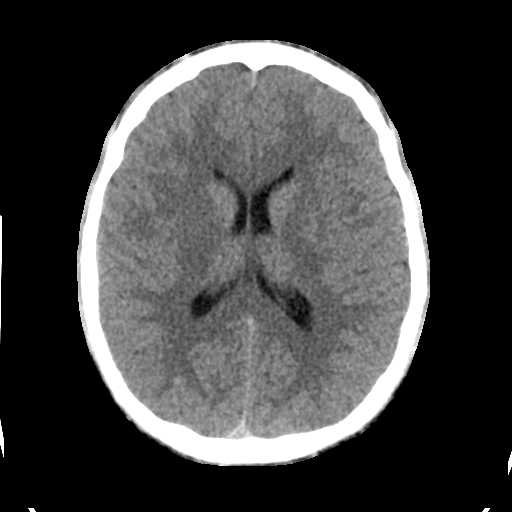
[im 23/35  brain]
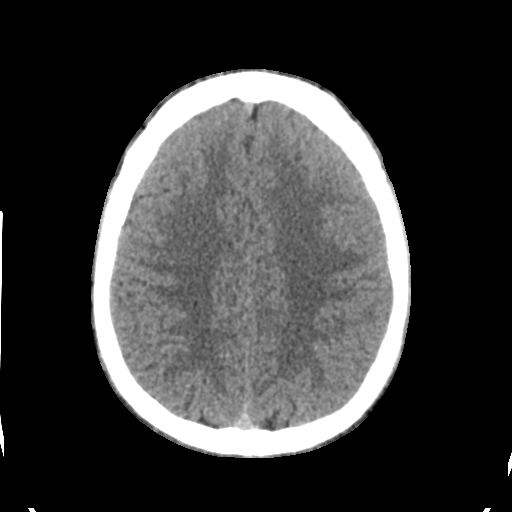
[im 29/35  brain]
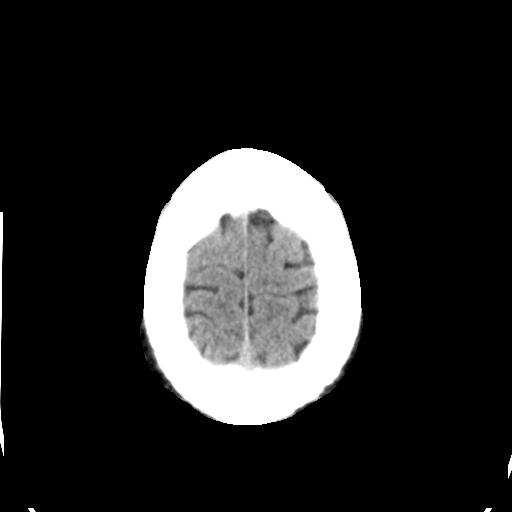
[im 29/35  bone]
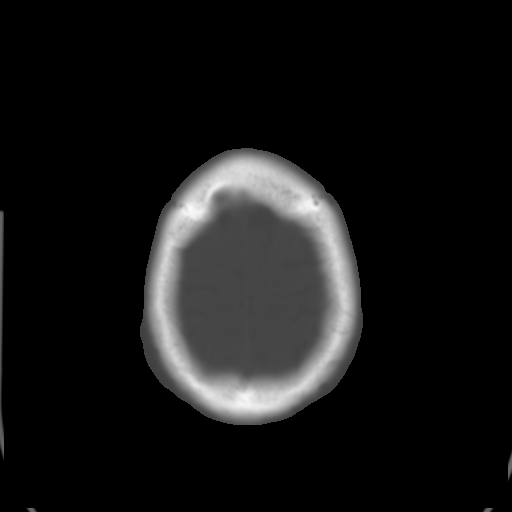

[Series 5: head 3.0 mpr cor · coronal · 0.36mm/px · 3 of 67 slices shown]
[im 17/67  brain]
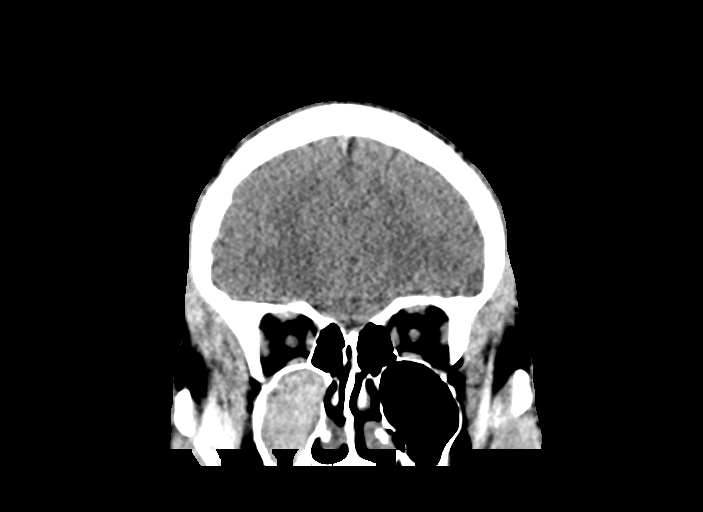
[im 34/67  brain]
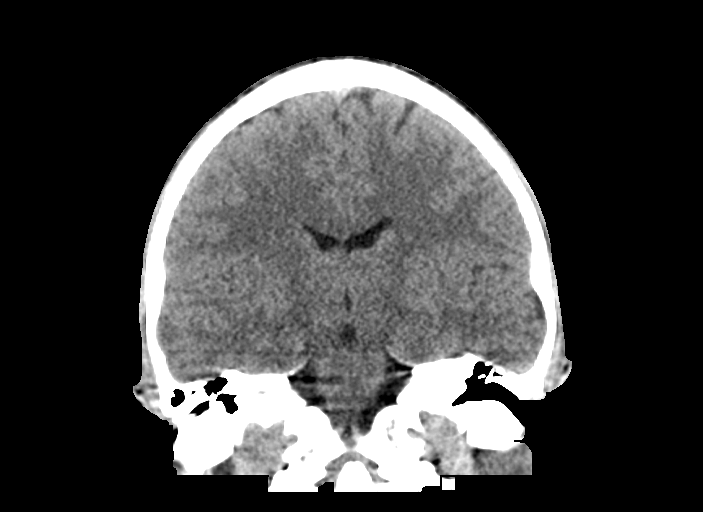
[im 50/67  brain]
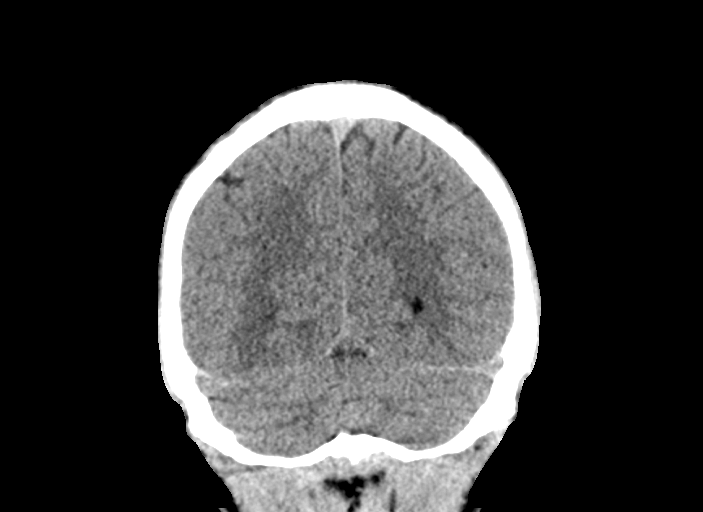

[Series 6: head 3.0 mpr sag · sagittal · 0.33mm/px · 2 of 67 slices shown]
[im 23/67  brain]
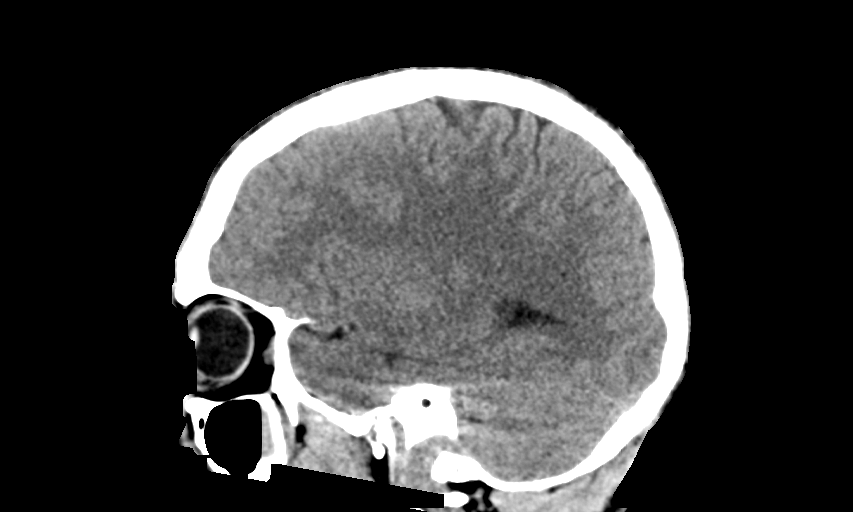
[im 45/67  brain]
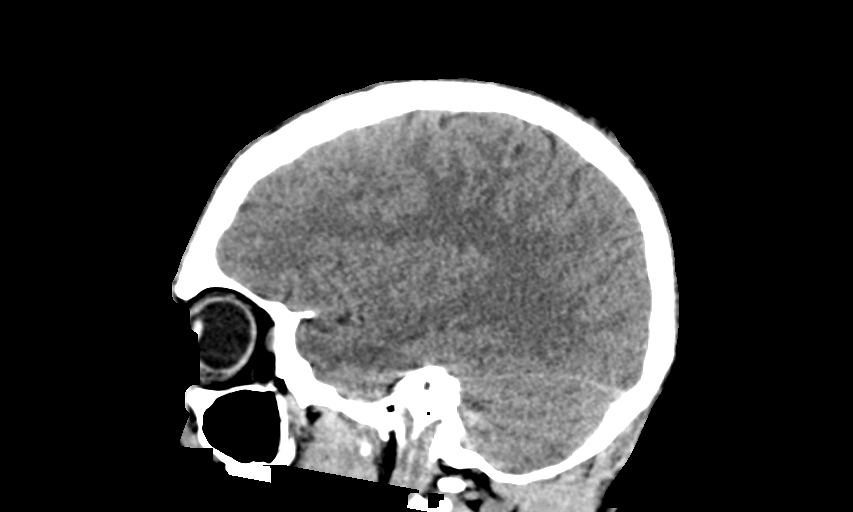

[Series 8: c_spine 2.0 st · axial · 0.39mm/px · z∈[-284,-196]mm · 4 of 120 slices shown]
[im 11/120  brain]
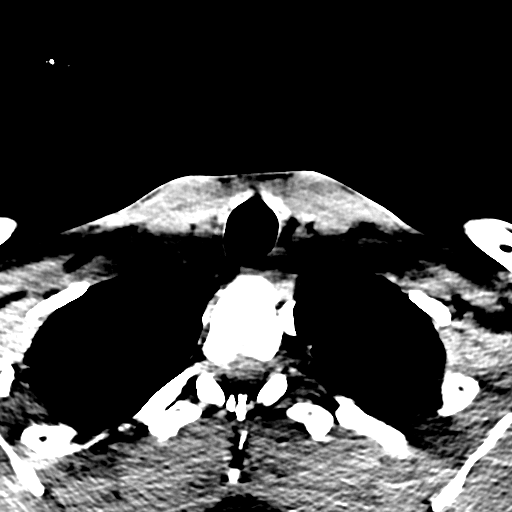
[im 22/120  brain]
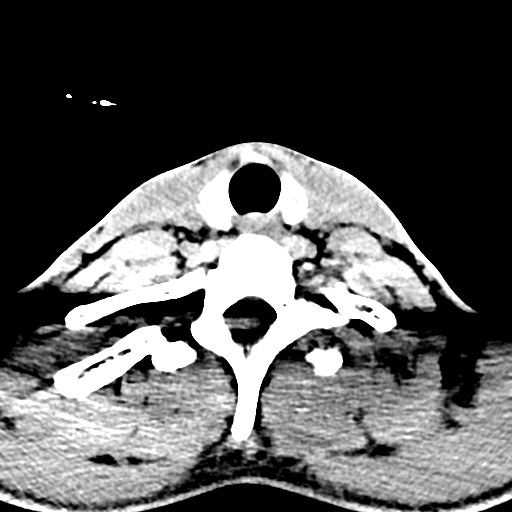
[im 38/120  brain]
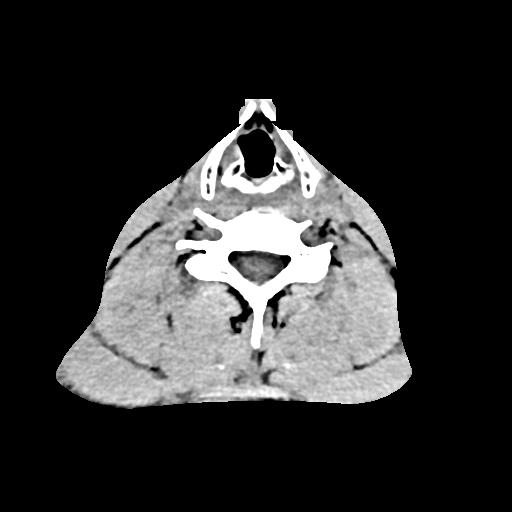
[im 55/120  brain]
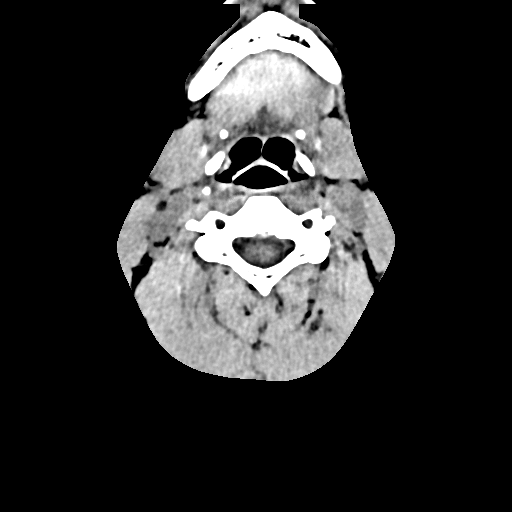

[14 of 47 positions shown; findings below may reference images not displayed]

FINDINGS: CT HEAD FINDINGS

Brain: No evidence of acute infarction, hemorrhage, hydrocephalus,
extra-axial collection or mass lesion/mass effect.

Vascular: No hyperdense vessel or unexpected calcification.

Skull: Multiple facial bone fractures again noted (see report for
maxillofacial CT 06/06/2017 for full description).

Sinuses/Orbits: High attenuation fluid levels in the maxillary
sinuses bilaterally, compatible with hemosinus.

Other: Gas in the deep soft tissues of the face related air facial
bone fractures again noted.

CT CERVICAL SPINE FINDINGS

Alignment: Normal.

Skull base and vertebrae: Please see separate dictation for
maxillofacial CT scan 06/06/2017 for full description of multiple
facial bone fractures previously described. No other acute fracture
of the cervical spine. No primary bone lesion or focal pathologic
process.

Soft tissues and spinal canal: No prevertebral fluid or swelling. No
visible canal hematoma.

Disc levels: No significant degenerative disc disease or facet
arthropathy.

Upper chest: Unremarkable.

Other: None.
IMPRESSION: 1. No evidence of significant acute traumatic injury to the brain.
The appearance of the brain is normal.
2. No evidence of significant acute traumatic injury to the cervical
spine.
3. Multiple facial bone fractures redemonstrated as previously
described on maxillofacial CT 06/06/2017 (see that report for full
description).

## 2019-09-28 ENCOUNTER — Ambulatory Visit: Payer: 59 | Attending: Internal Medicine

## 2019-09-28 DIAGNOSIS — Z20822 Contact with and (suspected) exposure to covid-19: Secondary | ICD-10-CM

## 2019-09-29 LAB — NOVEL CORONAVIRUS, NAA: SARS-CoV-2, NAA: NOT DETECTED

## 2020-04-20 ENCOUNTER — Ambulatory Visit (HOSPITAL_COMMUNITY): Admit: 2020-04-20 | Disposition: A | Discharge status: Home / Self Care | Payer: 59

## 2020-04-20 ENCOUNTER — Ambulatory Visit
Admission: EM | Admit: 2020-04-20 | Discharge: 2020-04-20 | Disposition: A | Discharge status: Home / Self Care | Payer: 59 | Attending: Physician Assistant | Admitting: Physician Assistant

## 2020-04-20 ENCOUNTER — Other Ambulatory Visit: Payer: Self-pay

## 2020-04-20 DIAGNOSIS — R1031 Right lower quadrant pain: Secondary | ICD-10-CM

## 2020-04-20 DIAGNOSIS — R1011 Right upper quadrant pain: Secondary | ICD-10-CM

## 2020-04-20 MED ORDER — MINERAL OIL RE ENEM: 1.0000 | ENEMA | Freq: Once | RECTAL | 0 refills | Status: AC

## 2020-04-20 NOTE — ED Triage Notes (Signed)
Pt c/o RLQ pain x12hrs. States pain has slowly worsen. States able to pass gas but pain when having a BM.

## 2020-04-20 NOTE — ED Provider Notes (Signed)
[facility]    CSN: [id number] Arrival date & time: 04/20/20  1497      History   Chief Complaint Chief Complaint  Patient presents with  . Abdominal Pain    HPI Carl Terry is a 29 y.o. male.   29 year old male comes in for 12 hour history of RLQ/RUQ pain. Pain is constant, dull in sensation, and has gradually worsened. Denies any nausea/vomiting. Had chills this morning without fever. States has been constipated for the past 2 days, and when straining, can cause pain to the area. Still passing flatus. No history of abdominal surgeries.      Past Medical History:  Diagnosis Date  . Dental crown present   . Heart murmur    states no known problems - no cardiologist  . Mandible fracture (HCC) 06/06/2017    Patient Active Problem List   Diagnosis Date Noted  . Mandible fracture (HCC) 06/07/2017    Past Surgical History:  Procedure Laterality Date  . MANDIBULAR HARDWARE REMOVAL N/A 08/18/2017   Procedure: MANDIBULAR HARDWARE REMOVAL;  Surgeon: Christia Reading, MD;  Location: [facility];  Service: ENT;  Laterality: N/A;  . ORIF MANDIBULAR FRACTURE Bilateral 06/06/2017   Procedure: OPEN REDUCTION INTERNAL FIXATION (ORIF) MANDIBLE SYMPHYSIS FRACTURE, BILATERAL LEFORT FRACTURES, MAXILLAR-MANDIBULAR FIXATION;  Surgeon: Christia Reading, MD;  Location: [facility] OR;  Service: ENT;  Laterality: Bilateral;  . WISDOM TOOTH EXTRACTION         Home Medications    Prior to Admission medications   Medication Sig Start Date End Date Taking? Authorizing Provider  mineral oil enema Place 133 mLs (1 enema total) rectally once for 1 dose. 04/20/20 04/20/20  Belinda Fisher, PA-C    Family History History reviewed. No pertinent family history.  Social History Social History   Tobacco Use  . Smoking status: Never Smoker  . Smokeless tobacco: Never Used  Vaping Use  . Vaping Use: Never used  Substance Use Topics  . Alcohol use: Yes    Comment: socially  . Drug  use: No     Allergies   Patient has no known allergies.   Review of Systems Review of Systems  Reason unable to perform ROS: See HPI as above.     Physical Exam Triage Vital Signs ED Triage Vitals  Enc Vitals Group     BP 04/20/20 0933 120/79     Pulse Rate 04/20/20 0933 89     Resp 04/20/20 0933 16     Temp 04/20/20 0933 98.8 F (37.1 C)     Temp Source 04/20/20 0933 Oral     SpO2 04/20/20 0933 98 %     Weight --      Height --      Head Circumference --      Peak Flow --      Pain Score 04/20/20 0948 4     Pain Loc --      Pain Edu? --      Excl. in GC? --    No data found.  Updated Vital Signs BP 120/79 (BP Location: Left Arm)   Pulse 89   Temp 98.8 F (37.1 C) (Oral)   Resp 16   SpO2 98%   Physical Exam Constitutional:      General: He is not in acute distress.    Appearance: Normal appearance. He is well-developed. He is not toxic-appearing or diaphoretic.  HENT:     Head: Normocephalic and atraumatic.  Eyes:  Conjunctiva/sclera: Conjunctivae normal.     Pupils: Pupils are equal, round, and reactive to light.  Cardiovascular:     Rate and Rhythm: Normal rate and regular rhythm.  Pulmonary:     Effort: Pulmonary effort is normal. No respiratory distress.     Comments: LCTAB Abdominal:     General: Bowel sounds are normal.     Palpations: Abdomen is soft.     Comments: Tenderness to RUQ/RLQ. Mild tenderness to LLQ. No guarding or rebound. No tenderness to Mcburney's point. Negative rovsing's sign.   Musculoskeletal:     Cervical back: Normal range of motion and neck supple.  Skin:    General: Skin is warm and dry.  Neurological:     Mental Status: He is alert and oriented to person, place, and time.    UC Treatments / Results  Labs (all labs ordered are listed, but only abnormal results are displayed) Labs Reviewed - No data to display  EKG   Radiology No results found.  Procedures Procedures (including critical care  time)  Medications Ordered in UC Medications - No data to display  Initial Impression / Assessment and Plan / UC Course  I have reviewed the triage vital signs and the nursing notes.  Pertinent labs & imaging results that were available during my care of the patient were reviewed by me and considered in my medical decision making (see chart for details).    Afebrile, nontoxic. Without nausea/vomiting. Abdomen soft, +BS. RUQ/RLQ pain with mild LLQ pain. No guarding or rebound. No tenderness to Mcburney's point. Negative Rovsing's sign. Discussed cannot rule out appendicitis. However, currently, given pain worsens when attempting BM, can try enema first and monitor symptoms. Push fluids. Strict return precautions given. Patient expresses understanding and agrees to plan.  Final Clinical Impressions(s) / UC Diagnoses   Final diagnoses:  Right upper quadrant abdominal pain  Right lower quadrant pain   ED Prescriptions    Medication Sig Dispense Auth. Provider   mineral oil enema Place 133 mLs (1 enema total) rectally once for 1 dose. 133 mL Belinda Fisher, PA-C     PDMP not reviewed this encounter.   Belinda Fisher, PA-C 04/20/20 1020

## 2020-04-20 NOTE — Discharge Instructions (Signed)
Mineral oil enema today and see if symptoms improve after bowel movement. Continue to monitor symptoms closely, if worsening symptoms, nausea/vomiting, fever, go to the emergency department for further evaluation.

## 2023-06-16 ENCOUNTER — Other Ambulatory Visit: Payer: Self-pay

## 2023-06-16 ENCOUNTER — Emergency Department: Payer: BC Managed Care – PPO

## 2023-06-16 ENCOUNTER — Emergency Department
Admission: EM | Admit: 2023-06-16 | Discharge: 2023-06-16 | Disposition: A | Discharge status: Home / Self Care | Payer: BC Managed Care – PPO | Attending: Emergency Medicine | Admitting: Emergency Medicine

## 2023-06-16 DIAGNOSIS — R1031 Right lower quadrant pain: Secondary | ICD-10-CM | POA: Insufficient documentation

## 2023-06-16 LAB — URINALYSIS, ROUTINE W REFLEX MICROSCOPIC
Bilirubin Urine: NEGATIVE
Glucose, UA: NEGATIVE mg/dL
Hgb urine dipstick: NEGATIVE
Ketones, ur: NEGATIVE mg/dL
Nitrite: NEGATIVE
Protein, ur: NEGATIVE mg/dL
Specific Gravity, Urine: 1.01 (ref 1.005–1.030)
pH: 7 (ref 5.0–8.0)

## 2023-06-16 LAB — COMPREHENSIVE METABOLIC PANEL
ALT: 17 U/L (ref 0–44)
AST: 17 U/L (ref 15–41)
Albumin: 4.4 g/dL (ref 3.5–5.0)
Alkaline Phosphatase: 62 U/L (ref 38–126)
Anion gap: 10 (ref 5–15)
BUN: 16 mg/dL (ref 6–20)
CO2: 26 mmol/L (ref 22–32)
Calcium: 9.1 mg/dL (ref 8.9–10.3)
Chloride: 101 mmol/L (ref 98–111)
Creatinine, Ser: 1.34 mg/dL — ABNORMAL HIGH (ref 0.61–1.24)
GFR, Estimated: >60 mL/min (ref >60)
Glucose, Bld: 105 mg/dL — ABNORMAL HIGH (ref 70–99)
Potassium: 4.1 mmol/L (ref 3.5–5.1)
Sodium: 137 mmol/L (ref 135–145)
Total Bilirubin: 3 mg/dL — ABNORMAL HIGH (ref 0.3–1.2)
Total Protein: 7.5 g/dL (ref 6.5–8.1)

## 2023-06-16 LAB — CBC
HCT: 46.8 % (ref 39.0–52.0)
Hemoglobin: 15.6 g/dL (ref 13.0–17.0)
MCH: 31.3 pg (ref 26.0–34.0)
MCHC: 33.3 g/dL (ref 30.0–36.0)
MCV: 93.8 fL (ref 80.0–100.0)
Platelets: 266 10*3/uL (ref 150–400)
RBC: 4.99 MIL/uL (ref 4.22–5.81)
RDW: 12.7 % (ref 11.5–15.5)
WBC: 4.1 10*3/uL (ref 4.0–10.5)
nRBC: 0 % (ref 0.0–0.2)

## 2023-06-16 LAB — LIPASE, BLOOD: Lipase: 37 U/L (ref 11–51)

## 2023-06-16 MED ORDER — IOHEXOL 300 MG/ML  SOLN
100.0000 mL | Freq: Once | INTRAMUSCULAR | Status: AC | PRN
  Administered 2023-06-16: 100 mL via INTRAVENOUS

## 2023-06-16 NOTE — ED Provider Notes (Signed)
Emergency department handoff note  Care of this patient was signed out to me at the end of the previous provider shift.  All pertinent patient information was conveyed and all questions were answered.  Patient pending CT of the abdomen and pelvis with IV contrast did not show any evidence of acute abnormalities.  The patient has been reexamined and is ready to be discharged.  All diagnostic results have been reviewed and discussed with the patient/family.  Care plan has been outlined and the patient/family understands all current diagnoses, results, and treatment plans.  There are no new complaints, changes, or physical findings at this time.  All questions have been addressed and answered.  Patient was instructed to, and agrees to follow-up with their primary care physician as well as return to the emergency department if any new or worsening symptoms develop.   Merwyn Katos, MD 06/16/23 [phone]

## 2023-06-16 NOTE — ED Provider Notes (Signed)
[facility] Provider Note    Event Date/Time   First MD Initiated Contact with Patient 06/16/23 1256     (approximate)   History   Chief Complaint RLQ pain   HPI  Torron Fehrman is a 32 y.o. male with no significant past medical history who presents to the ED complaining of abdominal pain.  Patient reports that he has been dealing with intermittent mild pain in the right lower quadrant of his abdomen for about the past 2 weeks.  Pain seem to get acutely worse last night and has been associated with some nausea but he has not had any vomiting.  He denies any fevers and denies any changes in his bowel movements, also denies any dysuria or flank pain.  He denies any history of similar symptoms, has never had surgery on his abdomen.     Physical Exam   Triage Vital Signs: ED Triage Vitals  Encounter Vitals Group     BP 06/16/23 1125 (!) 151/80     Systolic BP Percentile --      Diastolic BP Percentile --      Pulse Rate 06/16/23 1125 79     Resp 06/16/23 1125 18     Temp 06/16/23 1125 98.2 F (36.8 C)     Temp src --      SpO2 06/16/23 1125 100 %     Weight 06/16/23 1141 160 lb (72.6 kg)     Height 06/16/23 1141 6\' 1"  (1.854 m)     Head Circumference --      Peak Flow --      Pain Score 06/16/23 1124 5     Pain Loc --      Pain Education --      Exclude from Growth Chart --     Most recent vital signs: Vitals:   06/16/23 1125 06/16/23 1509  BP: (!) 151/80   Pulse: 79 67  Resp: 18   Temp: 98.2 F (36.8 C)   SpO2: 100% 95%    Constitutional: Alert and oriented. Eyes: Conjunctivae are normal. Head: Atraumatic. Nose: No congestion/rhinnorhea. Mouth/Throat: Mucous membranes are moist.  Cardiovascular: Normal rate, regular rhythm. Grossly normal heart sounds.  2+ radial pulses bilaterally. Respiratory: Normal respiratory effort.  No retractions. Lungs CTAB. Gastrointestinal: Soft and tender to palpation in the right lower quadrant with no  rebound or guarding. No distention. Musculoskeletal: No lower extremity tenderness nor edema.  Neurologic:  Normal speech and language. No gross focal neurologic deficits are appreciated.    ED Results / Procedures / Treatments   Labs (all labs ordered are listed, but only abnormal results are displayed) Labs Reviewed  COMPREHENSIVE METABOLIC PANEL - Abnormal; Notable for the following components:      Result Value   Glucose, Bld 105 (*)    Creatinine, Ser 1.34 (*)    Total Bilirubin 3.0 (*)    All other components within normal limits  URINALYSIS, ROUTINE W REFLEX MICROSCOPIC - Abnormal; Notable for the following components:   Color, Urine YELLOW (*)    APPearance CLEAR (*)    Leukocytes,Ua TRACE (*)    Bacteria, UA RARE (*)    All other components within normal limits  LIPASE, BLOOD  CBC   RADIOLOGY CT abdomen/pelvis reviewed and interpreted by me with no inflammatory changes, focal fluid collections, or dilated bowel loops.  PROCEDURES:  Critical Care performed: No  Procedures   MEDICATIONS ORDERED IN ED: Medications  iohexol (OMNIPAQUE) 300 MG/ML solution 100  mL (100 mLs Intravenous Contrast Given 06/16/23 1357)     IMPRESSION / MDM / ASSESSMENT AND PLAN / ED COURSE  I reviewed the triage vital signs and the nursing notes.                              32 y.o. male with no significant past medical history who presents to the ED complaining of mild pain in the abdomen for the past 2 weeks, acutely worse since last night.  Patient's presentation is most consistent with acute presentation with potential threat to life or bodily function.  Differential diagnosis includes, but is not limited to, appendicitis, kidney stone, cholecystitis, biliary colic, UTI, bowel obstruction.  Patient nontoxic-appearing and in no acute distress, vital signs are unremarkable.  His abdomen is soft but he seems to have focal tenderness in the right lower quadrant, will further assess  with CT imaging for appendicitis.  No significant anemia or leukocytosis noted, no electrolyte abnormality or AKI.  He does have mild elevation in his bilirubin, however this is comparable to labs from 6 years ago and additional LFTs are unremarkable, lipase within normal limits.  Urinalysis shows no signs of infection and no blood to suggest kidney stone.  Patient declines pain or nausea medication, will reassess following imaging.  Patient turned over to oncoming rider pending CT results and reassessment.      FINAL CLINICAL IMPRESSION(S) / ED DIAGNOSES   Final diagnoses:  RLQ abdominal pain     Rx / DC Orders   ED Discharge Orders     None        Note:  This document was prepared using Dragon voice recognition software and may include unintentional dictation errors.   Chesley Noon, MD 06/16/23 [phone]

## 2023-06-16 NOTE — ED Triage Notes (Signed)
Pt comes with c/o RLQ pain for about 2 weeks. Pt states pain has now gotten worse. Pt states diarrhea. Pt denies any vomiting.

## 2023-06-16 NOTE — Discharge Instructions (Addendum)
Please use ibuprofen (Motrin) up to 800 mg every 8 hours, naproxen (Naprosyn) up to 500 mg every 12 hours, and/or acetaminophen (Tylenol) up to 4 g/day for any continued pain.  Please do not use this medication regimen for longer than 7 days
